# Patient Record
Sex: Female | Born: 1960 | Race: White | Hispanic: No | Smoking: Never smoker
Health system: Southern US, Community
[De-identification: ages and names within clinical notes are randomized; demographics above are authoritative.]

## PROBLEM LIST (undated history)

## (undated) DIAGNOSIS — T7840XA Allergy, unspecified, initial encounter: Secondary | ICD-10-CM

## (undated) DIAGNOSIS — G629 Polyneuropathy, unspecified: Secondary | ICD-10-CM

## (undated) DIAGNOSIS — E785 Hyperlipidemia, unspecified: Secondary | ICD-10-CM

## (undated) DIAGNOSIS — N189 Chronic kidney disease, unspecified: Secondary | ICD-10-CM

## (undated) DIAGNOSIS — J45909 Unspecified asthma, uncomplicated: Secondary | ICD-10-CM

## (undated) DIAGNOSIS — F419 Anxiety disorder, unspecified: Secondary | ICD-10-CM

## (undated) DIAGNOSIS — E079 Disorder of thyroid, unspecified: Secondary | ICD-10-CM

## (undated) DIAGNOSIS — K259 Gastric ulcer, unspecified as acute or chronic, without hemorrhage or perforation: Secondary | ICD-10-CM

## (undated) DIAGNOSIS — K219 Gastro-esophageal reflux disease without esophagitis: Secondary | ICD-10-CM

## (undated) DIAGNOSIS — K449 Diaphragmatic hernia without obstruction or gangrene: Secondary | ICD-10-CM

## (undated) DIAGNOSIS — C73 Malignant neoplasm of thyroid gland: Principal | ICD-10-CM

## (undated) DIAGNOSIS — C801 Malignant (primary) neoplasm, unspecified: Secondary | ICD-10-CM

## (undated) HISTORY — DX: Malignant neoplasm of thyroid gland: C73

## (undated) HISTORY — PX: KNEE ARTHROSCOPY: SUR90

## (undated) HISTORY — DX: Gastro-esophageal reflux disease without esophagitis: K21.9

## (undated) HISTORY — DX: Malignant (primary) neoplasm, unspecified: C80.1

## (undated) HISTORY — PX: BLADDER AUGMENTATION: SHX1233

## (undated) HISTORY — PX: ABDOMINAL HYSTERECTOMY: SHX81

## (undated) HISTORY — DX: Chronic kidney disease, unspecified: N18.9

## (undated) HISTORY — PX: SHOULDER ARTHROSCOPY: SHX128

## (undated) HISTORY — DX: Unspecified asthma, uncomplicated: J45.909

## (undated) HISTORY — DX: Diaphragmatic hernia without obstruction or gangrene: K44.9

## (undated) HISTORY — DX: Disorder of thyroid, unspecified: E07.9

## (undated) HISTORY — DX: Hyperlipidemia, unspecified: E78.5

## (undated) HISTORY — DX: Gastric ulcer, unspecified as acute or chronic, without hemorrhage or perforation: K25.9

## (undated) HISTORY — DX: Allergy, unspecified, initial encounter: T78.40XA

## (undated) HISTORY — DX: Anxiety disorder, unspecified: F41.9

## (undated) HISTORY — DX: Polyneuropathy, unspecified: G62.9

---

## 2001-07-31 DIAGNOSIS — K259 Gastric ulcer, unspecified as acute or chronic, without hemorrhage or perforation: Secondary | ICD-10-CM

## 2001-07-31 HISTORY — DX: Gastric ulcer, unspecified as acute or chronic, without hemorrhage or perforation: K25.9

## 2005-07-31 DIAGNOSIS — C801 Malignant (primary) neoplasm, unspecified: Secondary | ICD-10-CM

## 2005-07-31 HISTORY — DX: Malignant (primary) neoplasm, unspecified: C80.1

## 2014-02-13 LAB — TB SKIN TEST: TB Skin Test: NEGATIVE

## 2014-07-27 ENCOUNTER — Encounter: Payer: Self-pay | Admitting: Family Medicine

## 2014-08-07 DIAGNOSIS — M542 Cervicalgia: Secondary | ICD-10-CM | POA: Diagnosis not present

## 2014-08-07 DIAGNOSIS — M9901 Segmental and somatic dysfunction of cervical region: Secondary | ICD-10-CM | POA: Diagnosis not present

## 2014-08-18 DIAGNOSIS — M542 Cervicalgia: Secondary | ICD-10-CM | POA: Diagnosis not present

## 2014-08-18 DIAGNOSIS — M9901 Segmental and somatic dysfunction of cervical region: Secondary | ICD-10-CM | POA: Diagnosis not present

## 2014-08-28 DIAGNOSIS — E079 Disorder of thyroid, unspecified: Secondary | ICD-10-CM | POA: Diagnosis not present

## 2014-09-04 DIAGNOSIS — M542 Cervicalgia: Secondary | ICD-10-CM | POA: Diagnosis not present

## 2014-09-04 DIAGNOSIS — M9901 Segmental and somatic dysfunction of cervical region: Secondary | ICD-10-CM | POA: Diagnosis not present

## 2014-09-07 DIAGNOSIS — Z1231 Encounter for screening mammogram for malignant neoplasm of breast: Secondary | ICD-10-CM | POA: Diagnosis not present

## 2014-09-08 DIAGNOSIS — E079 Disorder of thyroid, unspecified: Secondary | ICD-10-CM | POA: Diagnosis not present

## 2014-09-08 DIAGNOSIS — E039 Hypothyroidism, unspecified: Secondary | ICD-10-CM | POA: Diagnosis not present

## 2014-09-08 DIAGNOSIS — Z0000 Encounter for general adult medical examination without abnormal findings: Secondary | ICD-10-CM | POA: Diagnosis not present

## 2014-09-08 DIAGNOSIS — R42 Dizziness and giddiness: Secondary | ICD-10-CM | POA: Diagnosis not present

## 2014-09-08 DIAGNOSIS — E785 Hyperlipidemia, unspecified: Secondary | ICD-10-CM | POA: Diagnosis not present

## 2014-09-08 DIAGNOSIS — R109 Unspecified abdominal pain: Secondary | ICD-10-CM | POA: Diagnosis not present

## 2014-09-15 DIAGNOSIS — R109 Unspecified abdominal pain: Secondary | ICD-10-CM | POA: Diagnosis not present

## 2014-09-18 DIAGNOSIS — E785 Hyperlipidemia, unspecified: Secondary | ICD-10-CM | POA: Diagnosis not present

## 2014-09-18 DIAGNOSIS — R42 Dizziness and giddiness: Secondary | ICD-10-CM | POA: Diagnosis not present

## 2014-09-21 DIAGNOSIS — M9900 Segmental and somatic dysfunction of head region: Secondary | ICD-10-CM | POA: Diagnosis not present

## 2014-09-21 DIAGNOSIS — M531 Cervicobrachial syndrome: Secondary | ICD-10-CM | POA: Diagnosis not present

## 2014-10-29 DIAGNOSIS — M25552 Pain in left hip: Secondary | ICD-10-CM | POA: Diagnosis not present

## 2014-10-29 DIAGNOSIS — M545 Low back pain: Secondary | ICD-10-CM | POA: Diagnosis not present

## 2014-10-29 DIAGNOSIS — M24852 Other specific joint derangements of left hip, not elsewhere classified: Secondary | ICD-10-CM | POA: Diagnosis not present

## 2014-11-18 DIAGNOSIS — J329 Chronic sinusitis, unspecified: Secondary | ICD-10-CM | POA: Diagnosis not present

## 2014-12-04 DIAGNOSIS — M545 Low back pain: Secondary | ICD-10-CM | POA: Diagnosis not present

## 2014-12-07 DIAGNOSIS — M545 Low back pain: Secondary | ICD-10-CM | POA: Diagnosis not present

## 2014-12-08 DIAGNOSIS — M9900 Segmental and somatic dysfunction of head region: Secondary | ICD-10-CM | POA: Diagnosis not present

## 2014-12-08 DIAGNOSIS — M531 Cervicobrachial syndrome: Secondary | ICD-10-CM | POA: Diagnosis not present

## 2014-12-11 DIAGNOSIS — M9900 Segmental and somatic dysfunction of head region: Secondary | ICD-10-CM | POA: Diagnosis not present

## 2014-12-11 DIAGNOSIS — M531 Cervicobrachial syndrome: Secondary | ICD-10-CM | POA: Diagnosis not present

## 2014-12-14 DIAGNOSIS — M531 Cervicobrachial syndrome: Secondary | ICD-10-CM | POA: Diagnosis not present

## 2014-12-14 DIAGNOSIS — M545 Low back pain: Secondary | ICD-10-CM | POA: Diagnosis not present

## 2014-12-14 DIAGNOSIS — M9900 Segmental and somatic dysfunction of head region: Secondary | ICD-10-CM | POA: Diagnosis not present

## 2014-12-14 DIAGNOSIS — F419 Anxiety disorder, unspecified: Secondary | ICD-10-CM | POA: Diagnosis not present

## 2014-12-16 DIAGNOSIS — M531 Cervicobrachial syndrome: Secondary | ICD-10-CM | POA: Diagnosis not present

## 2014-12-16 DIAGNOSIS — M9900 Segmental and somatic dysfunction of head region: Secondary | ICD-10-CM | POA: Diagnosis not present

## 2014-12-21 DIAGNOSIS — M531 Cervicobrachial syndrome: Secondary | ICD-10-CM | POA: Diagnosis not present

## 2014-12-21 DIAGNOSIS — M9900 Segmental and somatic dysfunction of head region: Secondary | ICD-10-CM | POA: Diagnosis not present

## 2014-12-22 DIAGNOSIS — M545 Low back pain: Secondary | ICD-10-CM | POA: Diagnosis not present

## 2015-01-05 DIAGNOSIS — M545 Low back pain: Secondary | ICD-10-CM | POA: Diagnosis not present

## 2015-01-12 DIAGNOSIS — M545 Low back pain: Secondary | ICD-10-CM | POA: Diagnosis not present

## 2015-01-18 DIAGNOSIS — C73 Malignant neoplasm of thyroid gland: Secondary | ICD-10-CM | POA: Diagnosis not present

## 2015-01-19 DIAGNOSIS — I498 Other specified cardiac arrhythmias: Secondary | ICD-10-CM | POA: Diagnosis not present

## 2015-01-19 DIAGNOSIS — C73 Malignant neoplasm of thyroid gland: Secondary | ICD-10-CM | POA: Diagnosis not present

## 2015-01-19 DIAGNOSIS — E780 Pure hypercholesterolemia: Secondary | ICD-10-CM | POA: Diagnosis not present

## 2015-01-19 DIAGNOSIS — R002 Palpitations: Secondary | ICD-10-CM | POA: Diagnosis not present

## 2015-01-20 DIAGNOSIS — M531 Cervicobrachial syndrome: Secondary | ICD-10-CM | POA: Diagnosis not present

## 2015-01-20 DIAGNOSIS — M9900 Segmental and somatic dysfunction of head region: Secondary | ICD-10-CM | POA: Diagnosis not present

## 2015-01-26 DIAGNOSIS — M545 Low back pain: Secondary | ICD-10-CM | POA: Diagnosis not present

## 2015-01-27 DIAGNOSIS — M531 Cervicobrachial syndrome: Secondary | ICD-10-CM | POA: Diagnosis not present

## 2015-01-27 DIAGNOSIS — M9900 Segmental and somatic dysfunction of head region: Secondary | ICD-10-CM | POA: Diagnosis not present

## 2015-02-02 DIAGNOSIS — M545 Low back pain: Secondary | ICD-10-CM | POA: Diagnosis not present

## 2015-02-04 DIAGNOSIS — R002 Palpitations: Secondary | ICD-10-CM | POA: Diagnosis not present

## 2015-03-22 DIAGNOSIS — C73 Malignant neoplasm of thyroid gland: Secondary | ICD-10-CM | POA: Diagnosis not present

## 2015-03-22 DIAGNOSIS — I471 Supraventricular tachycardia: Secondary | ICD-10-CM | POA: Diagnosis not present

## 2015-04-23 DIAGNOSIS — M5430 Sciatica, unspecified side: Secondary | ICD-10-CM | POA: Diagnosis not present

## 2015-04-23 DIAGNOSIS — R109 Unspecified abdominal pain: Secondary | ICD-10-CM | POA: Diagnosis not present

## 2015-04-23 DIAGNOSIS — I471 Supraventricular tachycardia: Secondary | ICD-10-CM | POA: Diagnosis not present

## 2015-05-04 DIAGNOSIS — Z8719 Personal history of other diseases of the digestive system: Secondary | ICD-10-CM | POA: Diagnosis not present

## 2015-05-04 DIAGNOSIS — I471 Supraventricular tachycardia: Secondary | ICD-10-CM | POA: Diagnosis not present

## 2015-05-04 DIAGNOSIS — Z87891 Personal history of nicotine dependence: Secondary | ICD-10-CM | POA: Diagnosis not present

## 2015-05-04 DIAGNOSIS — R002 Palpitations: Secondary | ICD-10-CM | POA: Diagnosis not present

## 2015-05-04 DIAGNOSIS — Z7982 Long term (current) use of aspirin: Secondary | ICD-10-CM | POA: Diagnosis not present

## 2015-05-04 DIAGNOSIS — Z85850 Personal history of malignant neoplasm of thyroid: Secondary | ICD-10-CM | POA: Diagnosis not present

## 2015-05-04 DIAGNOSIS — J9811 Atelectasis: Secondary | ICD-10-CM | POA: Diagnosis not present

## 2015-05-04 DIAGNOSIS — E785 Hyperlipidemia, unspecified: Secondary | ICD-10-CM | POA: Diagnosis not present

## 2015-05-04 DIAGNOSIS — K267 Chronic duodenal ulcer without hemorrhage or perforation: Secondary | ICD-10-CM | POA: Diagnosis not present

## 2015-05-04 DIAGNOSIS — R0600 Dyspnea, unspecified: Secondary | ICD-10-CM | POA: Diagnosis not present

## 2015-05-04 DIAGNOSIS — M5430 Sciatica, unspecified side: Secondary | ICD-10-CM | POA: Diagnosis not present

## 2015-05-04 DIAGNOSIS — R9431 Abnormal electrocardiogram [ECG] [EKG]: Secondary | ICD-10-CM | POA: Diagnosis not present

## 2015-05-04 DIAGNOSIS — Z01810 Encounter for preprocedural cardiovascular examination: Secondary | ICD-10-CM | POA: Diagnosis not present

## 2015-05-04 DIAGNOSIS — Z5189 Encounter for other specified aftercare: Secondary | ICD-10-CM | POA: Diagnosis not present

## 2015-05-04 DIAGNOSIS — K449 Diaphragmatic hernia without obstruction or gangrene: Secondary | ICD-10-CM | POA: Diagnosis not present

## 2015-05-04 DIAGNOSIS — Z79899 Other long term (current) drug therapy: Secondary | ICD-10-CM | POA: Diagnosis not present

## 2015-05-04 DIAGNOSIS — R5383 Other fatigue: Secondary | ICD-10-CM | POA: Diagnosis not present

## 2015-05-05 DIAGNOSIS — Z85850 Personal history of malignant neoplasm of thyroid: Secondary | ICD-10-CM | POA: Diagnosis not present

## 2015-05-05 DIAGNOSIS — Z8719 Personal history of other diseases of the digestive system: Secondary | ICD-10-CM | POA: Diagnosis not present

## 2015-05-05 DIAGNOSIS — R5383 Other fatigue: Secondary | ICD-10-CM | POA: Diagnosis not present

## 2015-05-05 DIAGNOSIS — R002 Palpitations: Secondary | ICD-10-CM | POA: Diagnosis not present

## 2015-05-05 DIAGNOSIS — I471 Supraventricular tachycardia: Secondary | ICD-10-CM | POA: Diagnosis not present

## 2015-05-05 DIAGNOSIS — Z79899 Other long term (current) drug therapy: Secondary | ICD-10-CM | POA: Diagnosis not present

## 2015-05-07 DIAGNOSIS — E860 Dehydration: Secondary | ICD-10-CM | POA: Diagnosis present

## 2015-05-07 DIAGNOSIS — Z9889 Other specified postprocedural states: Secondary | ICD-10-CM | POA: Diagnosis not present

## 2015-05-07 DIAGNOSIS — R778 Other specified abnormalities of plasma proteins: Secondary | ICD-10-CM | POA: Diagnosis not present

## 2015-05-07 DIAGNOSIS — K810 Acute cholecystitis: Secondary | ICD-10-CM | POA: Diagnosis not present

## 2015-05-07 DIAGNOSIS — C73 Malignant neoplasm of thyroid gland: Secondary | ICD-10-CM | POA: Diagnosis not present

## 2015-05-07 DIAGNOSIS — E890 Postprocedural hypothyroidism: Secondary | ICD-10-CM | POA: Diagnosis present

## 2015-05-07 DIAGNOSIS — R7989 Other specified abnormal findings of blood chemistry: Secondary | ICD-10-CM | POA: Diagnosis not present

## 2015-05-07 DIAGNOSIS — R112 Nausea with vomiting, unspecified: Secondary | ICD-10-CM | POA: Diagnosis present

## 2015-05-07 DIAGNOSIS — J90 Pleural effusion, not elsewhere classified: Secondary | ICD-10-CM | POA: Diagnosis not present

## 2015-05-07 DIAGNOSIS — R110 Nausea: Secondary | ICD-10-CM | POA: Diagnosis not present

## 2015-05-07 DIAGNOSIS — N179 Acute kidney failure, unspecified: Secondary | ICD-10-CM | POA: Diagnosis not present

## 2015-05-07 DIAGNOSIS — R160 Hepatomegaly, not elsewhere classified: Secondary | ICD-10-CM | POA: Diagnosis not present

## 2015-05-07 DIAGNOSIS — Z886 Allergy status to analgesic agent status: Secondary | ICD-10-CM | POA: Diagnosis not present

## 2015-05-07 DIAGNOSIS — K449 Diaphragmatic hernia without obstruction or gangrene: Secondary | ICD-10-CM | POA: Diagnosis present

## 2015-05-07 DIAGNOSIS — Z85850 Personal history of malignant neoplasm of thyroid: Secondary | ICD-10-CM | POA: Diagnosis not present

## 2015-05-07 DIAGNOSIS — I481 Persistent atrial fibrillation: Secondary | ICD-10-CM | POA: Diagnosis present

## 2015-05-07 DIAGNOSIS — R748 Abnormal levels of other serum enzymes: Secondary | ICD-10-CM | POA: Diagnosis not present

## 2015-05-07 DIAGNOSIS — R1011 Right upper quadrant pain: Secondary | ICD-10-CM | POA: Diagnosis not present

## 2015-05-07 DIAGNOSIS — J9811 Atelectasis: Secondary | ICD-10-CM | POA: Diagnosis not present

## 2015-05-07 DIAGNOSIS — I4891 Unspecified atrial fibrillation: Secondary | ICD-10-CM | POA: Diagnosis not present

## 2015-05-07 DIAGNOSIS — R109 Unspecified abdominal pain: Secondary | ICD-10-CM | POA: Diagnosis not present

## 2015-05-07 DIAGNOSIS — I480 Paroxysmal atrial fibrillation: Secondary | ICD-10-CM | POA: Diagnosis not present

## 2015-05-07 DIAGNOSIS — Z884 Allergy status to anesthetic agent status: Secondary | ICD-10-CM | POA: Diagnosis not present

## 2015-05-07 DIAGNOSIS — E785 Hyperlipidemia, unspecified: Secondary | ICD-10-CM | POA: Diagnosis not present

## 2015-05-07 DIAGNOSIS — R1084 Generalized abdominal pain: Secondary | ICD-10-CM | POA: Diagnosis not present

## 2015-05-07 DIAGNOSIS — Z87891 Personal history of nicotine dependence: Secondary | ICD-10-CM | POA: Diagnosis not present

## 2015-05-07 DIAGNOSIS — K819 Cholecystitis, unspecified: Secondary | ICD-10-CM | POA: Diagnosis not present

## 2015-05-07 DIAGNOSIS — I319 Disease of pericardium, unspecified: Secondary | ICD-10-CM | POA: Diagnosis not present

## 2015-05-07 DIAGNOSIS — R188 Other ascites: Secondary | ICD-10-CM | POA: Diagnosis not present

## 2015-05-07 DIAGNOSIS — I340 Nonrheumatic mitral (valve) insufficiency: Secondary | ICD-10-CM | POA: Diagnosis not present

## 2015-05-07 DIAGNOSIS — I313 Pericardial effusion (noninflammatory): Secondary | ICD-10-CM | POA: Diagnosis present

## 2015-05-07 DIAGNOSIS — E039 Hypothyroidism, unspecified: Secondary | ICD-10-CM | POA: Diagnosis not present

## 2015-05-07 DIAGNOSIS — I482 Chronic atrial fibrillation: Secondary | ICD-10-CM | POA: Diagnosis not present

## 2015-05-11 DIAGNOSIS — D649 Anemia, unspecified: Secondary | ICD-10-CM | POA: Diagnosis not present

## 2015-05-11 DIAGNOSIS — N390 Urinary tract infection, site not specified: Secondary | ICD-10-CM | POA: Diagnosis not present

## 2015-05-11 DIAGNOSIS — I313 Pericardial effusion (noninflammatory): Secondary | ICD-10-CM | POA: Diagnosis not present

## 2015-05-11 DIAGNOSIS — R938 Abnormal findings on diagnostic imaging of other specified body structures: Secondary | ICD-10-CM | POA: Diagnosis not present

## 2015-05-11 DIAGNOSIS — K824 Cholesterolosis of gallbladder: Secondary | ICD-10-CM | POA: Diagnosis not present

## 2015-05-11 DIAGNOSIS — R160 Hepatomegaly, not elsewhere classified: Secondary | ICD-10-CM | POA: Diagnosis not present

## 2015-05-11 DIAGNOSIS — Z9889 Other specified postprocedural states: Secondary | ICD-10-CM | POA: Diagnosis not present

## 2015-05-11 DIAGNOSIS — Z8679 Personal history of other diseases of the circulatory system: Secondary | ICD-10-CM | POA: Diagnosis not present

## 2015-05-11 DIAGNOSIS — E876 Hypokalemia: Secondary | ICD-10-CM | POA: Diagnosis not present

## 2015-05-17 DIAGNOSIS — R0602 Shortness of breath: Secondary | ICD-10-CM | POA: Diagnosis not present

## 2015-05-17 DIAGNOSIS — I340 Nonrheumatic mitral (valve) insufficiency: Secondary | ICD-10-CM | POA: Diagnosis not present

## 2015-05-17 DIAGNOSIS — E785 Hyperlipidemia, unspecified: Secondary | ICD-10-CM | POA: Diagnosis not present

## 2015-05-19 DIAGNOSIS — R140 Abdominal distension (gaseous): Secondary | ICD-10-CM | POA: Diagnosis not present

## 2015-05-19 DIAGNOSIS — R109 Unspecified abdominal pain: Secondary | ICD-10-CM | POA: Diagnosis not present

## 2015-05-19 DIAGNOSIS — K30 Functional dyspepsia: Secondary | ICD-10-CM | POA: Diagnosis not present

## 2015-05-19 DIAGNOSIS — K219 Gastro-esophageal reflux disease without esophagitis: Secondary | ICD-10-CM | POA: Diagnosis not present

## 2015-05-20 DIAGNOSIS — I4891 Unspecified atrial fibrillation: Secondary | ICD-10-CM | POA: Diagnosis not present

## 2015-05-21 DIAGNOSIS — Z85850 Personal history of malignant neoplasm of thyroid: Secondary | ICD-10-CM | POA: Diagnosis not present

## 2015-05-21 DIAGNOSIS — R0781 Pleurodynia: Secondary | ICD-10-CM | POA: Diagnosis not present

## 2015-05-21 DIAGNOSIS — Z90710 Acquired absence of both cervix and uterus: Secondary | ICD-10-CM | POA: Diagnosis not present

## 2015-05-21 DIAGNOSIS — R1013 Epigastric pain: Secondary | ICD-10-CM | POA: Diagnosis present

## 2015-05-21 DIAGNOSIS — E785 Hyperlipidemia, unspecified: Secondary | ICD-10-CM | POA: Diagnosis present

## 2015-05-21 DIAGNOSIS — K824 Cholesterolosis of gallbladder: Secondary | ICD-10-CM | POA: Diagnosis present

## 2015-05-21 DIAGNOSIS — I309 Acute pericarditis, unspecified: Secondary | ICD-10-CM | POA: Diagnosis not present

## 2015-05-21 DIAGNOSIS — R1110 Vomiting, unspecified: Secondary | ICD-10-CM | POA: Diagnosis not present

## 2015-05-21 DIAGNOSIS — I301 Infective pericarditis: Secondary | ICD-10-CM | POA: Diagnosis not present

## 2015-05-21 DIAGNOSIS — I319 Disease of pericardium, unspecified: Secondary | ICD-10-CM | POA: Diagnosis not present

## 2015-05-21 DIAGNOSIS — M79661 Pain in right lower leg: Secondary | ICD-10-CM | POA: Diagnosis not present

## 2015-05-21 DIAGNOSIS — I10 Essential (primary) hypertension: Secondary | ICD-10-CM | POA: Diagnosis present

## 2015-05-21 DIAGNOSIS — I4891 Unspecified atrial fibrillation: Secondary | ICD-10-CM | POA: Diagnosis not present

## 2015-05-21 DIAGNOSIS — I313 Pericardial effusion (noninflammatory): Secondary | ICD-10-CM | POA: Diagnosis not present

## 2015-05-21 DIAGNOSIS — R0602 Shortness of breath: Secondary | ICD-10-CM | POA: Diagnosis not present

## 2015-05-21 DIAGNOSIS — I312 Hemopericardium, not elsewhere classified: Secondary | ICD-10-CM | POA: Diagnosis not present

## 2015-05-21 DIAGNOSIS — F419 Anxiety disorder, unspecified: Secondary | ICD-10-CM | POA: Diagnosis present

## 2015-05-21 DIAGNOSIS — R079 Chest pain, unspecified: Secondary | ICD-10-CM | POA: Diagnosis not present

## 2015-05-21 DIAGNOSIS — K219 Gastro-esophageal reflux disease without esophagitis: Secondary | ICD-10-CM | POA: Diagnosis not present

## 2015-05-21 DIAGNOSIS — K449 Diaphragmatic hernia without obstruction or gangrene: Secondary | ICD-10-CM | POA: Diagnosis present

## 2015-05-21 DIAGNOSIS — Z01818 Encounter for other preprocedural examination: Secondary | ICD-10-CM | POA: Diagnosis not present

## 2015-05-21 DIAGNOSIS — M7989 Other specified soft tissue disorders: Secondary | ICD-10-CM | POA: Diagnosis not present

## 2015-05-21 DIAGNOSIS — J9811 Atelectasis: Secondary | ICD-10-CM | POA: Diagnosis not present

## 2015-05-21 DIAGNOSIS — M79662 Pain in left lower leg: Secondary | ICD-10-CM | POA: Diagnosis not present

## 2015-05-21 DIAGNOSIS — J90 Pleural effusion, not elsewhere classified: Secondary | ICD-10-CM | POA: Diagnosis not present

## 2015-05-21 DIAGNOSIS — Z87891 Personal history of nicotine dependence: Secondary | ICD-10-CM | POA: Diagnosis not present

## 2015-05-21 DIAGNOSIS — R112 Nausea with vomiting, unspecified: Secondary | ICD-10-CM | POA: Diagnosis not present

## 2015-05-25 DIAGNOSIS — I313 Pericardial effusion (noninflammatory): Secondary | ICD-10-CM | POA: Diagnosis not present

## 2015-05-31 DIAGNOSIS — R1110 Vomiting, unspecified: Secondary | ICD-10-CM | POA: Diagnosis not present

## 2015-06-03 DIAGNOSIS — I313 Pericardial effusion (noninflammatory): Secondary | ICD-10-CM | POA: Diagnosis not present

## 2015-06-04 DIAGNOSIS — D649 Anemia, unspecified: Secondary | ICD-10-CM | POA: Diagnosis not present

## 2015-06-05 DIAGNOSIS — K449 Diaphragmatic hernia without obstruction or gangrene: Secondary | ICD-10-CM | POA: Diagnosis not present

## 2015-06-05 DIAGNOSIS — R079 Chest pain, unspecified: Secondary | ICD-10-CM | POA: Diagnosis not present

## 2015-06-05 DIAGNOSIS — R000 Tachycardia, unspecified: Secondary | ICD-10-CM | POA: Diagnosis not present

## 2015-06-05 DIAGNOSIS — I32 Pericarditis in diseases classified elsewhere: Secondary | ICD-10-CM | POA: Diagnosis not present

## 2015-06-05 DIAGNOSIS — I319 Disease of pericardium, unspecified: Secondary | ICD-10-CM | POA: Diagnosis not present

## 2015-06-05 DIAGNOSIS — F329 Major depressive disorder, single episode, unspecified: Secondary | ICD-10-CM | POA: Diagnosis not present

## 2015-06-05 DIAGNOSIS — E785 Hyperlipidemia, unspecified: Secondary | ICD-10-CM | POA: Diagnosis not present

## 2015-06-05 DIAGNOSIS — I313 Pericardial effusion (noninflammatory): Secondary | ICD-10-CM | POA: Diagnosis not present

## 2015-06-05 DIAGNOSIS — I4891 Unspecified atrial fibrillation: Secondary | ICD-10-CM | POA: Diagnosis not present

## 2015-06-05 LAB — PROTIME-INR: INR: 1 (ref 0.9–1.1)

## 2015-06-06 DIAGNOSIS — Z9889 Other specified postprocedural states: Secondary | ICD-10-CM | POA: Diagnosis not present

## 2015-06-06 DIAGNOSIS — R509 Fever, unspecified: Secondary | ICD-10-CM | POA: Diagnosis not present

## 2015-06-06 DIAGNOSIS — K8080 Other cholelithiasis without obstruction: Secondary | ICD-10-CM | POA: Diagnosis present

## 2015-06-06 DIAGNOSIS — F419 Anxiety disorder, unspecified: Secondary | ICD-10-CM | POA: Diagnosis not present

## 2015-06-06 DIAGNOSIS — R110 Nausea: Secondary | ICD-10-CM | POA: Diagnosis not present

## 2015-06-06 DIAGNOSIS — Z8249 Family history of ischemic heart disease and other diseases of the circulatory system: Secondary | ICD-10-CM | POA: Diagnosis not present

## 2015-06-06 DIAGNOSIS — Z886 Allergy status to analgesic agent status: Secondary | ICD-10-CM | POA: Diagnosis not present

## 2015-06-06 DIAGNOSIS — I300 Acute nonspecific idiopathic pericarditis: Secondary | ICD-10-CM | POA: Diagnosis not present

## 2015-06-06 DIAGNOSIS — Z87891 Personal history of nicotine dependence: Secondary | ICD-10-CM | POA: Diagnosis not present

## 2015-06-06 DIAGNOSIS — Z85850 Personal history of malignant neoplasm of thyroid: Secondary | ICD-10-CM | POA: Diagnosis not present

## 2015-06-06 DIAGNOSIS — R109 Unspecified abdominal pain: Secondary | ICD-10-CM | POA: Diagnosis not present

## 2015-06-06 DIAGNOSIS — F329 Major depressive disorder, single episode, unspecified: Secondary | ICD-10-CM | POA: Diagnosis not present

## 2015-06-06 DIAGNOSIS — R0781 Pleurodynia: Secondary | ICD-10-CM | POA: Diagnosis present

## 2015-06-06 DIAGNOSIS — Z884 Allergy status to anesthetic agent status: Secondary | ICD-10-CM | POA: Diagnosis not present

## 2015-06-06 DIAGNOSIS — R0602 Shortness of breath: Secondary | ICD-10-CM | POA: Diagnosis present

## 2015-06-06 DIAGNOSIS — J90 Pleural effusion, not elsewhere classified: Secondary | ICD-10-CM | POA: Diagnosis not present

## 2015-06-06 DIAGNOSIS — I4891 Unspecified atrial fibrillation: Secondary | ICD-10-CM | POA: Diagnosis not present

## 2015-06-06 DIAGNOSIS — K449 Diaphragmatic hernia without obstruction or gangrene: Secondary | ICD-10-CM | POA: Diagnosis present

## 2015-06-06 DIAGNOSIS — I313 Pericardial effusion (noninflammatory): Secondary | ICD-10-CM | POA: Diagnosis not present

## 2015-06-06 DIAGNOSIS — I319 Disease of pericardium, unspecified: Secondary | ICD-10-CM | POA: Diagnosis present

## 2015-06-06 DIAGNOSIS — A419 Sepsis, unspecified organism: Secondary | ICD-10-CM | POA: Diagnosis not present

## 2015-06-06 DIAGNOSIS — J9811 Atelectasis: Secondary | ICD-10-CM | POA: Diagnosis not present

## 2015-06-06 DIAGNOSIS — Z833 Family history of diabetes mellitus: Secondary | ICD-10-CM | POA: Diagnosis not present

## 2015-06-06 DIAGNOSIS — E785 Hyperlipidemia, unspecified: Secondary | ICD-10-CM | POA: Diagnosis present

## 2015-06-06 DIAGNOSIS — K219 Gastro-esophageal reflux disease without esophagitis: Secondary | ICD-10-CM | POA: Diagnosis present

## 2015-06-06 DIAGNOSIS — C73 Malignant neoplasm of thyroid gland: Secondary | ICD-10-CM | POA: Diagnosis not present

## 2015-06-06 DIAGNOSIS — F332 Major depressive disorder, recurrent severe without psychotic features: Secondary | ICD-10-CM | POA: Diagnosis not present

## 2015-06-06 DIAGNOSIS — R079 Chest pain, unspecified: Secondary | ICD-10-CM | POA: Diagnosis not present

## 2015-06-06 DIAGNOSIS — I309 Acute pericarditis, unspecified: Secondary | ICD-10-CM | POA: Diagnosis not present

## 2015-06-08 DIAGNOSIS — I313 Pericardial effusion (noninflammatory): Secondary | ICD-10-CM | POA: Diagnosis not present

## 2015-06-08 DIAGNOSIS — J9811 Atelectasis: Secondary | ICD-10-CM | POA: Diagnosis not present

## 2015-06-08 DIAGNOSIS — A419 Sepsis, unspecified organism: Secondary | ICD-10-CM | POA: Diagnosis not present

## 2015-06-08 DIAGNOSIS — J90 Pleural effusion, not elsewhere classified: Secondary | ICD-10-CM | POA: Diagnosis not present

## 2015-06-16 DIAGNOSIS — I319 Disease of pericardium, unspecified: Secondary | ICD-10-CM | POA: Diagnosis not present

## 2015-06-16 DIAGNOSIS — M9900 Segmental and somatic dysfunction of head region: Secondary | ICD-10-CM | POA: Diagnosis not present

## 2015-06-16 DIAGNOSIS — M436 Torticollis: Secondary | ICD-10-CM | POA: Diagnosis not present

## 2015-06-17 DIAGNOSIS — I319 Disease of pericardium, unspecified: Secondary | ICD-10-CM | POA: Diagnosis not present

## 2015-06-18 DIAGNOSIS — M9900 Segmental and somatic dysfunction of head region: Secondary | ICD-10-CM | POA: Diagnosis not present

## 2015-06-18 DIAGNOSIS — M436 Torticollis: Secondary | ICD-10-CM | POA: Diagnosis not present

## 2015-07-12 DIAGNOSIS — F419 Anxiety disorder, unspecified: Secondary | ICD-10-CM | POA: Diagnosis not present

## 2015-07-12 DIAGNOSIS — I318 Other specified diseases of pericardium: Secondary | ICD-10-CM | POA: Diagnosis not present

## 2015-07-12 DIAGNOSIS — I319 Disease of pericardium, unspecified: Secondary | ICD-10-CM | POA: Diagnosis not present

## 2015-07-15 DIAGNOSIS — Z129 Encounter for screening for malignant neoplasm, site unspecified: Secondary | ICD-10-CM | POA: Diagnosis not present

## 2015-07-15 DIAGNOSIS — C73 Malignant neoplasm of thyroid gland: Secondary | ICD-10-CM | POA: Diagnosis not present

## 2015-07-21 DIAGNOSIS — C73 Malignant neoplasm of thyroid gland: Secondary | ICD-10-CM | POA: Diagnosis not present

## 2015-07-29 DIAGNOSIS — I319 Disease of pericardium, unspecified: Secondary | ICD-10-CM | POA: Diagnosis not present

## 2015-08-12 DIAGNOSIS — I319 Disease of pericardium, unspecified: Secondary | ICD-10-CM | POA: Diagnosis not present

## 2015-08-26 DIAGNOSIS — C73 Malignant neoplasm of thyroid gland: Secondary | ICD-10-CM | POA: Diagnosis not present

## 2015-08-26 DIAGNOSIS — Z111 Encounter for screening for respiratory tuberculosis: Secondary | ICD-10-CM | POA: Diagnosis not present

## 2015-10-02 DIAGNOSIS — M9901 Segmental and somatic dysfunction of cervical region: Secondary | ICD-10-CM | POA: Diagnosis not present

## 2015-10-02 DIAGNOSIS — M9903 Segmental and somatic dysfunction of lumbar region: Secondary | ICD-10-CM | POA: Diagnosis not present

## 2015-10-02 DIAGNOSIS — M542 Cervicalgia: Secondary | ICD-10-CM | POA: Diagnosis not present

## 2015-10-02 DIAGNOSIS — M545 Low back pain: Secondary | ICD-10-CM | POA: Diagnosis not present

## 2015-10-07 DIAGNOSIS — N390 Urinary tract infection, site not specified: Secondary | ICD-10-CM | POA: Diagnosis not present

## 2015-10-07 DIAGNOSIS — R399 Unspecified symptoms and signs involving the genitourinary system: Secondary | ICD-10-CM | POA: Diagnosis not present

## 2015-10-11 DIAGNOSIS — M9902 Segmental and somatic dysfunction of thoracic region: Secondary | ICD-10-CM | POA: Diagnosis not present

## 2015-10-11 DIAGNOSIS — M546 Pain in thoracic spine: Secondary | ICD-10-CM | POA: Diagnosis not present

## 2015-10-15 DIAGNOSIS — M546 Pain in thoracic spine: Secondary | ICD-10-CM | POA: Diagnosis not present

## 2015-10-15 DIAGNOSIS — M9902 Segmental and somatic dysfunction of thoracic region: Secondary | ICD-10-CM | POA: Diagnosis not present

## 2015-10-19 DIAGNOSIS — M9902 Segmental and somatic dysfunction of thoracic region: Secondary | ICD-10-CM | POA: Diagnosis not present

## 2015-10-19 DIAGNOSIS — M546 Pain in thoracic spine: Secondary | ICD-10-CM | POA: Diagnosis not present

## 2015-11-25 DIAGNOSIS — I313 Pericardial effusion (noninflammatory): Secondary | ICD-10-CM | POA: Diagnosis not present

## 2015-11-25 DIAGNOSIS — F419 Anxiety disorder, unspecified: Secondary | ICD-10-CM | POA: Diagnosis not present

## 2015-11-25 DIAGNOSIS — I471 Supraventricular tachycardia: Secondary | ICD-10-CM | POA: Diagnosis not present

## 2015-11-25 DIAGNOSIS — R42 Dizziness and giddiness: Secondary | ICD-10-CM | POA: Diagnosis not present

## 2015-11-25 DIAGNOSIS — I319 Disease of pericardium, unspecified: Secondary | ICD-10-CM | POA: Diagnosis not present

## 2015-12-09 DIAGNOSIS — C73 Malignant neoplasm of thyroid gland: Secondary | ICD-10-CM | POA: Diagnosis not present

## 2015-12-22 DIAGNOSIS — I313 Pericardial effusion (noninflammatory): Secondary | ICD-10-CM | POA: Diagnosis not present

## 2015-12-28 DIAGNOSIS — C73 Malignant neoplasm of thyroid gland: Secondary | ICD-10-CM | POA: Diagnosis not present

## 2015-12-31 DIAGNOSIS — I319 Disease of pericardium, unspecified: Secondary | ICD-10-CM | POA: Diagnosis not present

## 2015-12-31 DIAGNOSIS — I471 Supraventricular tachycardia: Secondary | ICD-10-CM | POA: Diagnosis not present

## 2015-12-31 DIAGNOSIS — F419 Anxiety disorder, unspecified: Secondary | ICD-10-CM | POA: Diagnosis not present

## 2016-01-05 DIAGNOSIS — R42 Dizziness and giddiness: Secondary | ICD-10-CM | POA: Diagnosis not present

## 2016-01-10 ENCOUNTER — Encounter: Payer: Self-pay | Admitting: Family Medicine

## 2016-01-10 DIAGNOSIS — C73 Malignant neoplasm of thyroid gland: Secondary | ICD-10-CM | POA: Diagnosis not present

## 2016-01-14 DIAGNOSIS — Z9889 Other specified postprocedural states: Secondary | ICD-10-CM | POA: Diagnosis not present

## 2016-01-17 DIAGNOSIS — R42 Dizziness and giddiness: Secondary | ICD-10-CM | POA: Diagnosis not present

## 2016-01-17 DIAGNOSIS — E079 Disorder of thyroid, unspecified: Secondary | ICD-10-CM | POA: Diagnosis not present

## 2016-01-20 DIAGNOSIS — D649 Anemia, unspecified: Secondary | ICD-10-CM | POA: Diagnosis not present

## 2016-01-20 DIAGNOSIS — E559 Vitamin D deficiency, unspecified: Secondary | ICD-10-CM | POA: Diagnosis not present

## 2016-01-20 DIAGNOSIS — C73 Malignant neoplasm of thyroid gland: Secondary | ICD-10-CM | POA: Diagnosis not present

## 2016-01-20 DIAGNOSIS — E079 Disorder of thyroid, unspecified: Secondary | ICD-10-CM | POA: Diagnosis not present

## 2016-01-20 DIAGNOSIS — R197 Diarrhea, unspecified: Secondary | ICD-10-CM | POA: Diagnosis not present

## 2016-01-24 DIAGNOSIS — K219 Gastro-esophageal reflux disease without esophagitis: Secondary | ICD-10-CM | POA: Diagnosis not present

## 2016-01-24 DIAGNOSIS — E890 Postprocedural hypothyroidism: Secondary | ICD-10-CM | POA: Diagnosis not present

## 2016-01-24 DIAGNOSIS — E785 Hyperlipidemia, unspecified: Secondary | ICD-10-CM | POA: Diagnosis not present

## 2016-01-24 DIAGNOSIS — C73 Malignant neoplasm of thyroid gland: Secondary | ICD-10-CM | POA: Diagnosis not present

## 2016-01-25 DIAGNOSIS — N390 Urinary tract infection, site not specified: Secondary | ICD-10-CM | POA: Diagnosis not present

## 2016-01-26 DIAGNOSIS — Z85850 Personal history of malignant neoplasm of thyroid: Secondary | ICD-10-CM | POA: Diagnosis not present

## 2016-01-28 DIAGNOSIS — M9903 Segmental and somatic dysfunction of lumbar region: Secondary | ICD-10-CM | POA: Diagnosis not present

## 2016-01-28 DIAGNOSIS — M545 Low back pain: Secondary | ICD-10-CM | POA: Diagnosis not present

## 2016-02-02 DIAGNOSIS — E039 Hypothyroidism, unspecified: Secondary | ICD-10-CM | POA: Diagnosis not present

## 2016-04-27 ENCOUNTER — Ambulatory Visit (INDEPENDENT_AMBULATORY_CARE_PROVIDER_SITE_OTHER): Payer: Medicare Other | Admitting: Family Medicine

## 2016-04-27 ENCOUNTER — Ambulatory Visit (INDEPENDENT_AMBULATORY_CARE_PROVIDER_SITE_OTHER): Payer: Medicare Other

## 2016-04-27 ENCOUNTER — Encounter: Payer: Self-pay | Admitting: Family Medicine

## 2016-04-27 ENCOUNTER — Encounter (INDEPENDENT_AMBULATORY_CARE_PROVIDER_SITE_OTHER): Payer: Self-pay

## 2016-04-27 VITALS — BP 134/81 | HR 67 | Temp 98.6°F | Ht 70.0 in | Wt 179.5 lb

## 2016-04-27 DIAGNOSIS — E039 Hypothyroidism, unspecified: Secondary | ICD-10-CM | POA: Diagnosis not present

## 2016-04-27 DIAGNOSIS — E785 Hyperlipidemia, unspecified: Secondary | ICD-10-CM | POA: Insufficient documentation

## 2016-04-27 DIAGNOSIS — C73 Malignant neoplasm of thyroid gland: Secondary | ICD-10-CM

## 2016-04-27 DIAGNOSIS — Z1159 Encounter for screening for other viral diseases: Secondary | ICD-10-CM | POA: Diagnosis not present

## 2016-04-27 DIAGNOSIS — Z114 Encounter for screening for human immunodeficiency virus [HIV]: Secondary | ICD-10-CM

## 2016-04-27 DIAGNOSIS — Z780 Asymptomatic menopausal state: Secondary | ICD-10-CM

## 2016-04-27 DIAGNOSIS — Z85850 Personal history of malignant neoplasm of thyroid: Secondary | ICD-10-CM | POA: Diagnosis not present

## 2016-04-27 DIAGNOSIS — K219 Gastro-esophageal reflux disease without esophagitis: Secondary | ICD-10-CM | POA: Insufficient documentation

## 2016-04-27 HISTORY — DX: Malignant neoplasm of thyroid gland: C73

## 2016-04-27 NOTE — Progress Notes (Signed)
BP 134/81   Pulse 67   Temp 98.6 F (37 C) (Oral)   Ht 5' 10"  (1.778 m)   Wt 179 lb 8 oz (81.4 kg)   BMI 25.76 kg/m    Subjective:    Patient ID: Tina Gardner, female    DOB: 08/11/1960, 55 y.o.   MRN: [id number]  HPI: Tina Gardner is a 55 y.o. female presenting on 04/27/2016 for Establish Care   HPI Hypothyroidism Patient is coming in today to establish care with our office. She is coming in for thyroid check. She is currently on levothyroxine 125 g daily. She denies any issues with hair changes or nail changes or palpitations constipation or diarrhea or chest pain. Patient has a history of thyroid cancer and she is needing a new hematologist/oncologist here because she moved here to our area and her previous was too far away that she cannot continue to follow up with them. She said that most recently she had had some controversial scans and it said that there may be a nodule left lower may not be a nodule left and she had 2 different physicians that were telling her different things. She wants to go here to get established with somebody to try and get to the bottom of that. We will request the records so that we can get them to the hematology/oncology here. She just moved here from [place].  Hyperlipidemia Patient admits that she had been diagnosed previously with high cholesterol but is not currently on any medications but wants to go ahead and get this checked. Patient denies headaches, blurred vision, chest pains, shortness of breath, or weakness. Denies any side effects from medication and is content with current medication.   Patient is postmenopausal and is due for a DEXA scan and wants to go ahead and do that today.  GERD Patient has been having issues with belching and burping and some epigastric abdominal discomfort and pressure. This is been increasing over the past few months. She normally takes Zantac 75 milligrams once a day but feels like it is not  working.  Relevant past medical, surgical, family and social history reviewed and updated as indicated. Interim medical history since our last visit reviewed. Allergies and medications reviewed and updated.  Review of Systems  Constitutional: Negative for chills and fever.  HENT: Negative for congestion, ear discharge and ear pain.   Eyes: Negative for redness and visual disturbance.  Respiratory: Negative for chest tightness and shortness of breath.   Cardiovascular: Negative for chest pain, palpitations and leg swelling.  Gastrointestinal: Positive for abdominal pain. Negative for blood in stool, diarrhea, nausea and vomiting.  Endocrine: Negative for cold intolerance and heat intolerance.  Genitourinary: Negative for difficulty urinating and dysuria.  Musculoskeletal: Negative for back pain and gait problem.  Skin: Negative for rash.  Neurological: Negative for dizziness, light-headedness and headaches.  Psychiatric/Behavioral: Negative for agitation and behavioral problems.  All other systems reviewed and are negative.   Per HPI unless specifically indicated above  Social History   Social History  . Marital status: Widowed    Spouse name: N/A  . Number of children: N/A  . Years of education: N/A   Occupational History  . Not on file.   Social History Main Topics  . Smoking status: Never Smoker  . Smokeless tobacco: Never Used  . Alcohol use No  . Drug use: No  . Sexual activity: Not Currently   Other Topics Concern  . Not on file  Social History Narrative  . No narrative on file    Past Surgical History:  Procedure Laterality Date  . ABDOMINAL HYSTERECTOMY    . BLADDER AUGMENTATION    . KNEE ARTHROSCOPY    . SHOULDER ARTHROSCOPY      Family History  Problem Relation Age of Onset  . Arthritis Mother   . Hypertension Mother   . Hyperlipidemia Mother   . Depression Father       Medication List       Accurate as of 04/27/16 10:30 AM. Always use your  most recent med list.          levothyroxine 125 MCG tablet Commonly known as:  SYNTHROID, LEVOTHROID Take 125 mcg by mouth daily before breakfast.          Objective:    BP 134/81   Pulse 67   Temp 98.6 F (37 C) (Oral)   Ht 5' 10"  (1.778 m)   Wt 179 lb 8 oz (81.4 kg)   BMI 25.76 kg/m   Wt Readings from Last 3 Encounters:  04/27/16 179 lb 8 oz (81.4 kg)    Physical Exam  Constitutional: She is oriented to person, place, and time. She appears well-developed and well-nourished. No distress.  Eyes: Conjunctivae are normal.  Neck: Neck supple. No thyromegaly present.  Cardiovascular: Normal rate, regular rhythm, normal heart sounds and intact distal pulses.   No murmur heard. Pulmonary/Chest: Effort normal and breath sounds normal. No respiratory distress. She has no wheezes.  Abdominal: Soft. Bowel sounds are normal. She exhibits no distension. There is tenderness (Mild epigastric tenderness). There is no rebound and no guarding.  Musculoskeletal: Normal range of motion. She exhibits no edema or tenderness.  Lymphadenopathy:    She has no cervical adenopathy.  Neurological: She is alert and oriented to person, place, and time. Coordination normal.  Skin: Skin is warm and dry. No rash noted. She is not diaphoretic.  Psychiatric: She has a normal mood and affect. Her behavior is normal.  Nursing note and vitals reviewed.   No results found for this or any previous visit.    Assessment & Plan:   Problem List Items Addressed This Visit      Digestive   GERD (gastroesophageal reflux disease)   Relevant Orders   CMP14+EGFR (Completed)   CBC with Differential/Platelet (Completed)     Endocrine   Hypothyroidism   Relevant Medications   levothyroxine (SYNTHROID, LEVOTHROID) 112 MCG tablet   Other Relevant Orders   TSH (Completed)     Other   Hyperlipidemia LDL goal <130   Relevant Orders   Lipid panel (Completed)   History of thyroid cancer   Relevant Orders    Ambulatory referral to Hematology    Other Visit Diagnoses    Postmenopausal    -  Primary   Relevant Orders   DG Bone Density   Need for hepatitis C screening test       Relevant Orders   Hepatitis C antibody (Completed)   Screening for HIV without presence of risk factors       Relevant Orders   HIV antibody (Completed)       Follow up plan: Return in about 4 weeks (around 05/25/2016), or if symptoms worsen or fail to improve, for Well woman exam and Pap.  Caryl Pina, MD [facility] 04/27/2016, 10:30 AM

## 2016-04-28 LAB — LIPID PANEL
CHOL/HDL RATIO: 3.8 ratio (ref 0.0–4.4)
Cholesterol, Total: 227 mg/dL — ABNORMAL HIGH (ref 100–199)
HDL: 59 mg/dL (ref >39)
LDL CALC: 135 mg/dL — AB (ref 0–99)
TRIGLYCERIDES: 163 mg/dL — AB (ref 0–149)
VLDL CHOLESTEROL CAL: 33 mg/dL (ref 5–40)

## 2016-04-28 LAB — CBC WITH DIFFERENTIAL/PLATELET
BASOS: 1 %
Basophils Absolute: 0 10*3/uL (ref 0.0–0.2)
EOS (ABSOLUTE): 0.1 10*3/uL (ref 0.0–0.4)
Eos: 1 %
Hematocrit: 35.7 % (ref 34.0–46.6)
Hemoglobin: 12.1 g/dL (ref 11.1–15.9)
IMMATURE GRANULOCYTES: 0 %
Immature Grans (Abs): 0 10*3/uL (ref 0.0–0.1)
LYMPHS ABS: 1.5 10*3/uL (ref 0.7–3.1)
Lymphs: 36 %
MCH: 29.7 pg (ref 26.6–33.0)
MCHC: 33.9 g/dL (ref 31.5–35.7)
MCV: 88 fL (ref 79–97)
MONOS ABS: 0.2 10*3/uL (ref 0.1–0.9)
Monocytes: 5 %
NEUTROS PCT: 57 %
Neutrophils Absolute: 2.3 10*3/uL (ref 1.4–7.0)
PLATELETS: 195 10*3/uL (ref 150–379)
RBC: 4.08 x10E6/uL (ref 3.77–5.28)
RDW: 13.3 % (ref 12.3–15.4)
WBC: 4.1 10*3/uL (ref 3.4–10.8)

## 2016-04-28 LAB — CMP14+EGFR
ALBUMIN: 4.9 g/dL (ref 3.5–5.5)
ALT: 15 IU/L (ref 0–32)
AST: 14 IU/L (ref 0–40)
Albumin/Globulin Ratio: 2.1 (ref 1.2–2.2)
Alkaline Phosphatase: 26 IU/L — ABNORMAL LOW (ref 39–117)
BUN / CREAT RATIO: 25 — AB (ref 9–23)
BUN: 22 mg/dL (ref 6–24)
Bilirubin Total: 0.8 mg/dL (ref 0.0–1.2)
CALCIUM: 9.3 mg/dL (ref 8.7–10.2)
CO2: 23 mmol/L (ref 18–29)
CREATININE: 0.87 mg/dL (ref 0.57–1.00)
Chloride: 100 mmol/L (ref 96–106)
GFR calc Af Amer: 88 mL/min/{1.73_m2} (ref >59)
GFR, EST NON AFRICAN AMERICAN: 76 mL/min/{1.73_m2} (ref >59)
GLOBULIN, TOTAL: 2.3 g/dL (ref 1.5–4.5)
Glucose: 93 mg/dL (ref 65–99)
Potassium: 4.3 mmol/L (ref 3.5–5.2)
SODIUM: 139 mmol/L (ref 134–144)
Total Protein: 7.2 g/dL (ref 6.0–8.5)

## 2016-04-28 LAB — HIV ANTIBODY (ROUTINE TESTING W REFLEX): HIV Screen 4th Generation wRfx: NONREACTIVE

## 2016-04-28 LAB — HEPATITIS C ANTIBODY: Hep C Virus Ab: <0.1 s/co ratio (ref 0.0–0.9)

## 2016-04-28 LAB — TSH: TSH: 0.053 u[IU]/mL — ABNORMAL LOW (ref 0.450–4.500)

## 2016-04-28 MED ORDER — LEVOTHYROXINE SODIUM 112 MCG PO TABS: 112.0000 ug | ORAL_TABLET | Freq: Every day | ORAL | 3 refills | Status: DC

## 2016-05-01 ENCOUNTER — Telehealth: Payer: Self-pay | Admitting: Family Medicine

## 2016-05-01 NOTE — Telephone Encounter (Signed)
Patient aware of results and message sent back to provider in lab results.

## 2016-05-02 ENCOUNTER — Telehealth: Payer: Self-pay | Admitting: Family Medicine

## 2016-05-03 NOTE — Telephone Encounter (Signed)
refaxed request as directed

## 2016-05-10 ENCOUNTER — Encounter: Payer: Self-pay | Admitting: Family Medicine

## 2016-05-10 ENCOUNTER — Ambulatory Visit (INDEPENDENT_AMBULATORY_CARE_PROVIDER_SITE_OTHER): Payer: Medicare Other | Admitting: Family Medicine

## 2016-05-10 VITALS — BP 120/78 | HR 70 | Temp 99.2°F | Ht 70.0 in | Wt 174.5 lb

## 2016-05-10 DIAGNOSIS — H60543 Acute eczematoid otitis externa, bilateral: Secondary | ICD-10-CM

## 2016-05-10 NOTE — Progress Notes (Signed)
BP 120/78   Pulse 70   Temp 99.2 F (37.3 C) (Oral)   Ht 5\' 10"  (1.778 m)   Wt 174 lb 8 oz (79.2 kg)   BMI 25.04 kg/m    Subjective:    Patient ID: Tina Gardner, female    DOB: 11-20-1960, 55 y.o.   MRN: [id number]  HPI: Tina Gardner is a 55 y.o. female presenting on 05/10/2016 for Left ear pain (has had drainage from left ear, roaring noise in ear)   HPI Left ear discomfort and drainage and ringing Patient has been having left ear discomfort and some waxy drainage and ringing in left ear but also has some of each in right ear was not nearly as bad. This is been going on for at least a week or maybe more. Doesn't have to some hearing loss she started to notice but just with hypertension. Patient denies any fevers or chills or nasal symptoms or upper respiratory symptoms. Is mostly in the ears. She does admit that she gets very itchy in her ear canals and feels like she has to scratch and they're all time.  Relevant past medical, surgical, family and social history reviewed and updated as indicated. Interim medical history since our last visit reviewed. Allergies and medications reviewed and updated.  Review of Systems  Constitutional: Negative for chills and fever.  HENT: Positive for ear discharge and tinnitus. Negative for congestion and ear pain.   Eyes: Negative for redness and visual disturbance.  Respiratory: Negative for cough, chest tightness, shortness of breath and wheezing.   Cardiovascular: Negative for chest pain and leg swelling.  Genitourinary: Negative for difficulty urinating and dysuria.  Musculoskeletal: Negative for back pain and gait problem.  Skin: Negative for rash.  Neurological: Negative for light-headedness and headaches.  Psychiatric/Behavioral: Negative for agitation and behavioral problems.  All other systems reviewed and are negative.   Per HPI unless specifically indicated above     Medication List       Accurate as of 05/10/16  4:19 PM.  Always use your most recent med list.          levothyroxine 125 MCG tablet Commonly known as:  SYNTHROID, LEVOTHROID Take 125 mcg by mouth daily before breakfast.   levothyroxine 112 MCG tablet Commonly known as:  SYNTHROID, LEVOTHROID Take 1 tablet (112 mcg total) by mouth daily.          Objective:    BP 120/78   Pulse 70   Temp 99.2 F (37.3 C) (Oral)   Ht 5\' 10"  (1.778 m)   Wt 174 lb 8 oz (79.2 kg)   BMI 25.04 kg/m   Wt Readings from Last 3 Encounters:  05/10/16 174 lb 8 oz (79.2 kg)  04/27/16 179 lb 8 oz (81.4 kg)    Physical Exam  Constitutional: She is oriented to person, place, and time. She appears well-developed and well-nourished. No distress.  HENT:  Right Ear: There is drainage (Wax buildup but not impaction). No swelling or tenderness. Tympanic membrane is not injected, not scarred, not perforated, not erythematous and not retracted. No middle ear effusion.  Left Ear: There is drainage (Wax buildup but not impaction). No swelling or tenderness. Tympanic membrane is not injected, not scarred, not perforated, not erythematous and not retracted.  No middle ear effusion.  Eyes: Conjunctivae are normal.  Neck: Neck supple. No thyromegaly present.  Cardiovascular: Normal rate, regular rhythm, normal heart sounds and intact distal pulses.   No murmur heard. Pulmonary/Chest: Effort  normal and breath sounds normal. No respiratory distress. She has no wheezes.  Musculoskeletal: Normal range of motion. She exhibits no edema or tenderness.  Lymphadenopathy:    She has no cervical adenopathy.  Neurological: She is alert and oriented to person, place, and time. Coordination normal.  Skin: Skin is warm and dry. No rash noted. She is not diaphoretic.  Psychiatric: She has a normal mood and affect. Her behavior is normal.  Nursing note and vitals reviewed.     Assessment & Plan:   Problem List Items Addressed This Visit    None    Visit Diagnoses    Eczema of  both external ears    -  Primary   Recommended mineral oil drops bilaterally 2-3 times daily.       Follow up plan: Return if symptoms worsen or fail to improve.  Counseling provided for all of the vaccine components No orders of the defined types were placed in this encounter.   Caryl Pina, MD [facility] 05/10/2016, 4:19 PM

## 2016-05-15 ENCOUNTER — Other Ambulatory Visit: Payer: Self-pay | Admitting: Family Medicine

## 2016-05-15 DIAGNOSIS — Z780 Asymptomatic menopausal state: Secondary | ICD-10-CM

## 2016-05-31 ENCOUNTER — Encounter (HOSPITAL_COMMUNITY): Payer: Medicare Other

## 2016-05-31 ENCOUNTER — Encounter (HOSPITAL_COMMUNITY): Payer: Medicare Other | Attending: Hematology | Admitting: Hematology

## 2016-05-31 ENCOUNTER — Encounter (HOSPITAL_COMMUNITY): Payer: Self-pay | Admitting: Hematology

## 2016-05-31 ENCOUNTER — Other Ambulatory Visit (HOSPITAL_COMMUNITY): Payer: Self-pay | Admitting: *Deleted

## 2016-05-31 VITALS — BP 131/80 | HR 64 | Temp 97.8°F | Resp 18 | Ht 70.0 in | Wt 174.5 lb

## 2016-05-31 DIAGNOSIS — C73 Malignant neoplasm of thyroid gland: Secondary | ICD-10-CM

## 2016-05-31 LAB — T4, FREE: Free T4: 1.4 ng/dL — ABNORMAL HIGH (ref 0.61–1.12)

## 2016-05-31 LAB — COMPREHENSIVE METABOLIC PANEL
ALK PHOS: 27 U/L — AB (ref 38–126)
ALT: 17 U/L (ref 14–54)
ANION GAP: 7 (ref 5–15)
AST: 16 U/L (ref 15–41)
Albumin: 4.7 g/dL (ref 3.5–5.0)
BUN: 22 mg/dL — ABNORMAL HIGH (ref 6–20)
CALCIUM: 9.3 mg/dL (ref 8.9–10.3)
CHLORIDE: 105 mmol/L (ref 101–111)
CO2: 24 mmol/L (ref 22–32)
Creatinine, Ser: 0.81 mg/dL (ref 0.44–1.00)
GFR calc non Af Amer: >60 mL/min (ref >60)
Glucose, Bld: 109 mg/dL — ABNORMAL HIGH (ref 65–99)
POTASSIUM: 4 mmol/L (ref 3.5–5.1)
SODIUM: 136 mmol/L (ref 135–145)
Total Bilirubin: 0.7 mg/dL (ref 0.3–1.2)
Total Protein: 7.4 g/dL (ref 6.5–8.1)

## 2016-05-31 LAB — CBC WITH DIFFERENTIAL/PLATELET
Basophils Absolute: 0 10*3/uL (ref 0.0–0.1)
Basophils Relative: 0 %
EOS ABS: 0.1 10*3/uL (ref 0.0–0.7)
EOS PCT: 2 %
HCT: 35.5 % — ABNORMAL LOW (ref 36.0–46.0)
Hemoglobin: 11.9 g/dL — ABNORMAL LOW (ref 12.0–15.0)
LYMPHS ABS: 1.6 10*3/uL (ref 0.7–4.0)
Lymphocytes Relative: 33 %
MCH: 29.6 pg (ref 26.0–34.0)
MCHC: 33.5 g/dL (ref 30.0–36.0)
MCV: 88.3 fL (ref 78.0–100.0)
MONO ABS: 0.2 10*3/uL (ref 0.1–1.0)
MONOS PCT: 4 %
Neutro Abs: 2.9 10*3/uL (ref 1.7–7.7)
Neutrophils Relative %: 61 %
PLATELETS: 178 10*3/uL (ref 150–400)
RBC: 4.02 MIL/uL (ref 3.87–5.11)
RDW: 12.5 % (ref 11.5–15.5)
WBC: 4.8 10*3/uL (ref 4.0–10.5)

## 2016-05-31 LAB — TSH: TSH: 0.048 u[IU]/mL — AB (ref 0.350–4.500)

## 2016-05-31 NOTE — Progress Notes (Signed)
[name]    HEMATOLOGY/ONCOLOGY CONSULTATION NOTE  Date of Service: 05/31/2016  Patient Care Team: Tina Rancher, MD as PCP - General (Family Medicine)  [facility] Endocrinology -Dr Tina Gardner            Cardiologist- Dr Tina Gardner                        CHIEF COMPLAINTS/PURPOSE OF CONSULTATION:  Thyroid cancer  HISTORY OF PRESENTING ILLNESS:   Tina Gardner is a wonderful 55 y.o. female who has been referred to [place] by Dr .Tina Rancher, MD  for evaluation and continued management of thyroid cancer.  Patient has multiple medical comorbidities as noted below including history of severe anxiety. She reports that she moved to [place] recently from [facility] and wanted to be set up for continued management of her previous history of papillary thyroid cancer which was predominantly being managed by her endocrinologist Dr. Juleen Gardner.  Her history appropriate thyroid cancer and subsequent treatments and follow-up have been beast together with her brought input and limited outside records.  Patient reports that she presented with neck nodule for couple of years and had an FNA that showed thyroid cancer. She subsequently underwent a total thyroidectomy on 07/11/2006 at [facility].  Surgical pathology showed a 2 cm x 1.8 cm x 1.5 cm papillary thyroid cancer. She underwent radioactive iodine ablation in favor 2007. She apparently underwent repeat reactive iodine ablation in 2008 due to positive uptake in the liver. PET/CT scan from 2008 through 2011 apparently showed activity in the liver and the tonsillar area and neck. She underwent bilateral tonsillectomy in 2010 due to positive activities in the tonsils which apparently did not show any cancer which showed bad infection as per patient's record. After 2011 imaging studies apparently showed no liver pathology. Iodine-131 whole-body scan on 11/22/2011 at [facility] apparently showed no evidence  of metastatic disease. Patient has been on a set suppression with levothyroxine 125 g per several years.   Her last available imaging studies with CT chest abdomen pelvis on 06/08/2015 at [facility] showed no overt evidence of metastatic disease. PET/CT scan at [facility] on 07/21/2015 showed status post total thyroidectomy status, no evidence of local recurrence or metastatic disease. Small pericardial effusion.  Last available labs showed negative antithyroglobulin antibody and undetectable thyroglobulin level on 12/28/2015. TSH was 0.2 with a free T4 of 1.87 at the time.  Patient continues to be on levothyroxine 125 g. She does not have a local endocrinologist at this time and was referred to [place] first. There is no evidence that the patient has radioactive iodine ablation refractory or active well differentiated thyroid cancer at this time.   MEDICAL HISTORY:  Past Medical History:  Diagnosis Date  . Allergy   . Anxiety   . Asthma   . Cancer [facility]) 2007   thyroid  . Chronic kidney disease   . GERD (gastroesophageal reflux disease)   . Hiatal hernia   . Hyperlipidemia   . Neuropathy ([place])   . Stomach ulcer 2003  . Thyroid disease   Kidney stones Paroxysmal atrial tachycardia status post ablation. He developed pericardial effusion thought to be from microperforation treated with colchicine and follow-up. Hepatitis A as a child Severe anxiety issues since age 67yrs -was previously on Lexapro Hiatal hernia with acid reflux and esophagitis Gastric ulcer  Hypothyroidism due to total thyroidectomy for thyroid cancer and chronic levothyroxine for TSH  suppression.  SURGICAL HISTORY: Past Surgical History:  Procedure Laterality Date  . ABDOMINAL HYSTERECTOMY    . BLADDER AUGMENTATION    . KNEE ARTHROSCOPY    . SHOULDER ARTHROSCOPY    Tonsillectomy Bladder lift Thyroidectomy -total  SOCIAL HISTORY: Social History   Social History  . Marital  status: Widowed    Spouse name: N/A  . Number of children: N/A  . Years of education: N/A   Occupational History  . Not on file.   Social History Main Topics  . Smoking status: Never Smoker  . Smokeless tobacco: Never Used  . Alcohol use No  . Drug use: No  . Sexual activity: Not Currently   Other Topics Concern  . Not on file   Social History Narrative  . No narrative on file  Previously worked in Programmer, applications with healthcare companies . She is a single parent with a special needs child with an autistic spectrum disorder .   FAMILY HISTORY: Family History  Problem Relation Age of Onset  . Arthritis Mother   . Hypertension Mother   . Hyperlipidemia Mother   . Depression Father   No family history of blood disorders or cancer . Patient reports that she does not know her father's side of the family much .   ALLERGIES:  is allergic to anesthesia [id number] [propofol].  MEDICATIONS:  Current Outpatient Prescriptions  Medication Sig Dispense Refill  . levothyroxine (SYNTHROID, LEVOTHROID) 112 MCG tablet Take 1 tablet (112 mcg total) by mouth daily. 90 tablet 3  . levothyroxine (SYNTHROID, LEVOTHROID) 125 MCG tablet Take 125 mcg by mouth daily before breakfast.     No current facility-administered medications for this visit.     REVIEW OF SYSTEMS:    10 Point review of Systems was done is negative except as noted above.  PHYSICAL EXAMINATION: ECOG PERFORMANCE STATUS: 1 - Symptomatic but completely ambulatory  . Vitals:   05/31/16 1200  BP: 131/80  Pulse: 64  Resp: 18  Temp: 97.8 F (36.6 C)   Filed Weights   05/31/16 1200  Weight: 174 lb 8 oz (79.2 kg)   .Body mass index is 25.04 kg/m.  GENERAL:alert, in no acute distress and comfortable SKIN: skin color, texture, turgor are normal, no rashes or significant lesions EYES: normal, conjunctiva are pink and non-injected, sclera clear OROPHARYNX:no exudate, no erythema and lips, buccal mucosa, and tongue  normal  NECK: supple, no JVD, No palpable neck nodules , non-tender, without nodularity LYMPH:  no palpable lymphadenopathy in the cervical, axillary or inguinal LUNGS: clear to auscultation with normal respiratory effort HEART: regular rate & rhythm,  no murmurs and no lower extremity edema ABDOMEN: abdomen soft, non-tender, normoactive bowel sounds  Musculoskeletal: no cyanosis of digits and no clubbing  PSYCH: alert & oriented x 3 with fluent speech NEURO: no focal motor/sensory deficits  LABORATORY DATA:  I have reviewed the data as listed  . CBC Latest Ref Rng & Units 05/31/2016 04/27/2016  WBC 4.0 - 10.5 K/uL 4.8 4.1  Hemoglobin 12.0 - 15.0 g/dL 11.9(L) -  Hematocrit 36.0 - 46.0 % 35.5(L) 35.7  Platelets 150 - 400 K/uL 178 195    . CMP Latest Ref Rng & Units 05/31/2016 04/27/2016  Glucose 65 - 99 mg/dL 109(H) 93  BUN 6 - 20 mg/dL 22(H) 22  Creatinine 0.44 - 1.00 mg/dL 0.81 0.87  Sodium 135 - 145 mmol/L 136 139  Potassium 3.5 - 5.1 mmol/L 4.0 4.3  Chloride 101 - 111 mmol/L 105 100  CO2 22 - 32 mmol/L 24 23  Calcium 8.9 - 10.3 mg/dL 9.3 9.3  Total Protein 6.5 - 8.1 g/dL 7.4 7.2  Total Bilirubin 0.3 - 1.2 mg/dL 0.7 0.8  Alkaline Phos 38 - 126 U/L 27(L) 26(L)  AST 15 - 41 U/L 16 14  ALT 14 - 54 U/L 17 15   Component     Latest Ref Rng & Units 05/31/2016  T4,Free(Direct)     0.61 - 1.12 ng/dL 1.40 (H)  TSH     0.350 - 4.500 uIU/mL 0.048 (L)     RADIOGRAPHIC STUDIES: I have personally reviewed the radiological images as listed and agreed with the findings in the report. Dg Wrfm Dexa  Result Date: 05/15/2016 EXAM: DG DXA BONE DENSITY STUDY The Bone Mineral Densitometry hard-copy report (which includes all data, graphical display, and FRAX results when applicable) has been sent directly to the ordering physician. This report can also be obtained electronically by viewing images for this exam through the performing facility's EMR, or by logging directly into [employer].  Electronically Signed   By: Lavonia Dana M.D.   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 Patient status post total thyroidectomy. She has had radioactive iodine ablation 2 in feb 2007 and then in 2008. There was some imaging concern for abnormal uptake in her tonsils and liver. She had bilateral tonsillectomy- pathology results aren't available but patient reports only infection noted. Her liver lesion was not biopsied to prove that it was metastatic papillary thyroid cancer. Liver lesion has not shown up on imaging since 2011.  Her last thyroglobulin level was undetectable with negative therapy latter bodies in May 2017. Last outside available PET/CT report from 07/21/2015 showed no evidence of local recurrence or metastatic disease. Plan -Patient is on levothyroxine 125 g by mouth daily for TSH suppression -She has been referred to endocrinology for management of her well-differentiated thyroid cancer for continued follow-up and for appropriate levothyroxine dosing for continued TSH suppression and to determine TSH goals for suppression. -She has had a history of paroxysmal atrial tachycardia and significant anxiety which might play into her plan for TSH suppression as well this has been previously ablated and we are uncertain that this is still an issue. -We shall get a thyroglobulin level and thyroglobulin antibody level today. -We will set her for a PET/CT scan -Return with Dr. Whitney Muse in 2 weeks to review her lab results and PET/CT findings. -If it is no evidence of active papillary thyroid cancer at this time she would be best served by continued follow-up with endocrinology and then consulting [place] in the setting of RAI refractory metastatic WDTC if the need arises. No role for TKI therapy at this time. -Will need close follow-up for evaluation and management of osteopenia/osteoporosis  with her primary care physician given thyroid over replacement and previous history of total hysterectomy with bilateral salpingo-oophorectomy.  #2 multiple medical comorbidities as noted above -Continue follow-up with primary care physician   All of the patients questions were answered with apparent satisfaction. The patient knows to call the clinic with any problems, questions or concerns.  I spent 60 minutes counseling the patient face to face. The total time spent in the appointment was 70 minutes and more than 50% was on counseling and direct patient cares.    Sullivan Lone MD [place] AAHIVMS [facility] Hematology/Oncology Physician [place]  [place]  (Office):       [phone] (Work cell):  [phone] (Fax):           [phone]

## 2016-05-31 NOTE — Patient Instructions (Addendum)
[place] at [facility]  Discharge Instructions:  Seen by MD [facility] today.  Pet scan within 1 week.  Follow up with MD [place] in 2 weeks. _______________________________________________________________  Thank you for choosing [place] at [facility] to provide your oncology and hematology care.  To afford each patient quality time with our providers, please arrive at least 15 minutes before your scheduled appointment.  You need to re-schedule your appointment if you arrive 10 or more minutes late.  We strive to give you quality time with our providers, and arriving late affects you and other patients whose appointments are after yours.  Also, if you no show three or more times for appointments you may be dismissed from the clinic.  Again, thank you for choosing [place] [facility] [place] hope is that these requests will allow you access to exceptional care and in a timely manner. _______________________________________________________________  If you have questions after your visit, please contact our office at [phone] between the hours of 8:30 a.m. and 5:00 p.m. Voicemails left after 4:30 p.m. will not be returned until the following business day. _______________________________________________________________  For prescription refill requests, have your pharmacy contact our office. _______________________________________________________________  Recommendations made by the consultant and any test results will be sent to your referring physician. _______________________________________________________________

## 2016-06-01 LAB — THYROGLOBULIN ANTIBODY: Thyroglobulin Antibody: <1 IU/mL (ref 0.0–0.9)

## 2016-06-08 ENCOUNTER — Encounter (HOSPITAL_COMMUNITY): Payer: Self-pay | Admitting: Radiology

## 2016-06-08 ENCOUNTER — Ambulatory Visit (HOSPITAL_COMMUNITY)
Admission: RE | Admit: 2016-06-08 | Discharge: 2016-06-08 | Disposition: A | Discharge status: Home / Self Care | Payer: Medicare Other | Source: Ambulatory Visit | Attending: Hematology | Admitting: Hematology

## 2016-06-08 DIAGNOSIS — C73 Malignant neoplasm of thyroid gland: Secondary | ICD-10-CM | POA: Diagnosis not present

## 2016-06-08 LAB — GLUCOSE, CAPILLARY: GLUCOSE-CAPILLARY: 94 mg/dL (ref 65–99)

## 2016-06-08 MED ORDER — FLUDEOXYGLUCOSE F - 18 (FDG) INJECTION
8.6000 | Freq: Once | INTRAVENOUS | Status: AC | PRN
  Administered 2016-06-08: 8.6 via INTRAVENOUS

## 2016-06-12 ENCOUNTER — Other Ambulatory Visit: Payer: Medicare Other | Admitting: Family Medicine

## 2016-06-13 ENCOUNTER — Telehealth: Payer: Self-pay | Admitting: Family Medicine

## 2016-06-13 ENCOUNTER — Encounter: Payer: Self-pay | Admitting: Family Medicine

## 2016-06-16 ENCOUNTER — Encounter (HOSPITAL_COMMUNITY): Payer: Medicare Other | Attending: Oncology | Admitting: Oncology

## 2016-06-16 ENCOUNTER — Encounter (HOSPITAL_COMMUNITY): Payer: Self-pay | Admitting: Oncology

## 2016-06-16 DIAGNOSIS — Z85850 Personal history of malignant neoplasm of thyroid: Secondary | ICD-10-CM | POA: Diagnosis present

## 2016-06-16 DIAGNOSIS — C73 Malignant neoplasm of thyroid gland: Secondary | ICD-10-CM

## 2016-06-16 NOTE — Assessment & Plan Note (Addendum)
Papillary thyroid cancer, predominantly managed by her endocrinologist, Dr. Juleen Starr, in [place], [place], initially diagnosed in 2007.  Labs performed on 05/31/2016: CBC diff, CMET, TSH, free T4, thyroglobulin antibody.  I personally reviewed and went over laboratory results with the patient.  The results are noted within this dictation.  I personally reviewed and went over radiographic studies with the patient.  The results are noted within this dictation.  PET scan on 06/08/2016 is reviewed and is negative for recurrent disease.  Referral to endocrinology has been made.  Referral notes are reviewed and it is noted that the endocrinology office has been trying to contact the patient but no working #.  The patient reports that her cell phone is broken and provides another phone number: [phone].  I will ask our scheduler to call endocrinology for an appointment that we can provide her today.  We dis receive an appointment date and time with Dr. Dwyane Dee: 07/03/2016 at 1245 PM.  Patient is advised.  No further follow-up with medical oncology is recommended.
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
## 2016-06-16 NOTE — Patient Instructions (Addendum)
[place] at [facility] Discharge Instructions  RECOMMENDATIONS MADE BY THE CONSULTANT AND ANY TEST RESULTS WILL BE SENT TO YOUR REFERRING PHYSICIAN.  You were seen today by Kirby Crigler PA-C. You have an appointment with the endocrinologist on 07/03/16 at 12:45, Dr. Dwyane Dee. You will not need anymore follow up here.    Thank you for choosing [place] at [facility] to provide your oncology and hematology care.  To afford each patient quality time with our provider, please arrive at least 15 minutes before your scheduled appointment time.   Beginning January 23rd 2017 lab work for the [facility] will be done in the  Main lab at [facility] on 1st floor. If you have a lab appointment with the [facility] please come in thru the  Main Entrance and check in at the main information desk  You need to re-schedule your appointment should you arrive 10 or more minutes late.  We strive to give you quality time with our providers, and arriving late affects you and other patients whose appointments are after yours.  Also, if you no show three or more times for appointments you may be dismissed from the clinic at the providers discretion.     Again, thank you for choosing [facility].  Our hope is that these requests will decrease the amount of time that you wait before being seen by our physicians.       _____________________________________________________________  Should you have questions after your visit to [facility], please contact our office at [phone] between the hours of 8:30 a.m. and 4:30 p.m.  Voicemails left after 4:30 p.m. will not be returned until the following business day.  For prescription refill requests, have your pharmacy contact our office.         Resources For Cancer Patients and their Caregivers ? American Cancer Society: Can assist with transportation, wigs, general needs, runs Look Good  Feel Better.        (450) 329-7210 ? Cancer Care: Provides financial assistance, online support groups, medication/co-pay assistance.  1-800-813-HOPE 8476459664) ? [facility] Assists [place] cancer patients and their families through emotional , educational and financial support.  3091406107 ? [place] DSS Where to apply for food stamps, Medicaid and utility assistance. 984-880-6343 ? [employer]: Transportation to medical appointments. 754 746 2788 ? Social Security Administration: May apply for disability if have a Stage IV cancer. (252)295-4748 (234)851-8769 ? [employer], Disability and Transit Services: Assists with nutrition, care and transit needs. [facility] Support Programs: @10RELATIVEDAYS @ > Cancer Support Group  2nd Tuesday of the month 1pm-2pm, Journey Room  > Creative Journey  3rd Tuesday of the month 1130am-1pm, Journey Room  > Look Good Feel Better  1st Wednesday of the month 10am-12 noon, Journey Room (Call [facility] to register [phone])

## 2016-06-21 ENCOUNTER — Telehealth: Payer: Self-pay | Admitting: Family Medicine

## 2016-06-21 MED ORDER — LEVOTHYROXINE SODIUM 125 MCG PO TABS: 125.0000 ug | ORAL_TABLET | Freq: Every day | ORAL | 3 refills | Status: DC

## 2016-06-21 NOTE — Telephone Encounter (Signed)
Pt aware of no show policy  Pt states she should be on 14mcg of levothyroxine - instead of 112. She had labs with oncology - in epic = can we change and refill the correct dose with her being seen right now

## 2016-06-21 NOTE — Telephone Encounter (Signed)
Yes go ahead and change the dose

## 2016-06-21 NOTE — Telephone Encounter (Signed)
Pt aware.

## 2016-07-03 ENCOUNTER — Ambulatory Visit: Payer: Medicare Other | Admitting: Endocrinology

## 2016-07-19 ENCOUNTER — Ambulatory Visit: Payer: Medicare Other | Admitting: Endocrinology

## 2016-08-30 ENCOUNTER — Encounter: Payer: Self-pay | Admitting: Family Medicine

## 2016-08-30 ENCOUNTER — Ambulatory Visit (INDEPENDENT_AMBULATORY_CARE_PROVIDER_SITE_OTHER): Payer: Medicare Other | Admitting: Family Medicine

## 2016-08-30 DIAGNOSIS — M51369 Other intervertebral disc degeneration, lumbar region without mention of lumbar back pain or lower extremity pain: Secondary | ICD-10-CM | POA: Insufficient documentation

## 2016-08-30 DIAGNOSIS — M5136 Other intervertebral disc degeneration, lumbar region: Secondary | ICD-10-CM | POA: Insufficient documentation

## 2016-08-30 MED ORDER — DICLOFENAC SODIUM 1 % TD GEL: 2.0000 g | Freq: Four times a day (QID) | TRANSDERMAL | 5 refills | Status: DC

## 2016-08-30 MED ORDER — METHYLPREDNISOLONE ACETATE 80 MG/ML IJ SUSP
80.0000 mg | Freq: Once | INTRAMUSCULAR | Status: AC
  Administered 2016-08-30: 80 mg via INTRAMUSCULAR
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
## 2016-09-06 ENCOUNTER — Other Ambulatory Visit: Payer: Self-pay | Admitting: Family Medicine

## 2016-09-06 DIAGNOSIS — M5136 Other intervertebral disc degeneration, lumbar region: Secondary | ICD-10-CM

## 2016-09-15 ENCOUNTER — Ambulatory Visit (HOSPITAL_COMMUNITY): Payer: Medicaid Other

## 2016-09-19 ENCOUNTER — Ambulatory Visit (HOSPITAL_COMMUNITY): Payer: Medicare Other

## 2016-09-27 ENCOUNTER — Ambulatory Visit (HOSPITAL_COMMUNITY): Admission: RE | Admit: 2016-09-27 | Payer: Medicaid Other | Source: Ambulatory Visit

## 2016-09-30 ENCOUNTER — Ambulatory Visit (HOSPITAL_COMMUNITY): Admission: RE | Admit: 2016-09-30 | Payer: Medicare Other | Source: Ambulatory Visit

## 2016-10-07 ENCOUNTER — Ambulatory Visit (HOSPITAL_COMMUNITY)
Admission: RE | Admit: 2016-10-07 | Discharge: 2016-10-07 | Disposition: A | Discharge status: Home / Self Care | Payer: Medicare Other | Source: Ambulatory Visit | Attending: Family Medicine | Admitting: Family Medicine

## 2016-10-07 DIAGNOSIS — M545 Low back pain: Secondary | ICD-10-CM | POA: Insufficient documentation

## 2016-10-07 DIAGNOSIS — G8929 Other chronic pain: Secondary | ICD-10-CM | POA: Insufficient documentation

## 2016-10-07 DIAGNOSIS — M5136 Other intervertebral disc degeneration, lumbar region: Secondary | ICD-10-CM | POA: Diagnosis not present

## 2016-10-09 ENCOUNTER — Other Ambulatory Visit: Payer: Self-pay

## 2016-10-09 ENCOUNTER — Telehealth: Payer: Self-pay

## 2016-10-09 DIAGNOSIS — M5136 Other intervertebral disc degeneration, lumbar region: Secondary | ICD-10-CM

## 2016-10-31 DIAGNOSIS — M25552 Pain in left hip: Secondary | ICD-10-CM | POA: Diagnosis not present

## 2016-10-31 DIAGNOSIS — R030 Elevated blood-pressure reading, without diagnosis of hypertension: Secondary | ICD-10-CM | POA: Diagnosis not present

## 2016-11-01 ENCOUNTER — Telehealth: Payer: Self-pay | Admitting: Family Medicine

## 2016-11-01 DIAGNOSIS — M5136 Other intervertebral disc degeneration, lumbar region: Secondary | ICD-10-CM

## 2016-11-01 NOTE — Telephone Encounter (Signed)
FYI

## 2016-11-17 NOTE — Telephone Encounter (Signed)
Disregard this encounter.

## 2016-11-23 ENCOUNTER — Ambulatory Visit: Payer: Medicare Other | Admitting: Family Medicine

## 2016-11-23 ENCOUNTER — Ambulatory Visit: Payer: Medicare Other | Admitting: Pediatrics

## 2016-11-23 NOTE — Telephone Encounter (Signed)
What symptoms do you have? Back pain  How long have you been sick? Pt had an appt with Det[facility]er about this problem.  Have you been seen for this problem? Yes, pt wants to talk to Det[facility]er. She was referred to a neurosurgeon and was not pleased. She is upset and wants another referral to see another doctor with her pain.   If your provider decides to give you a prescription, which pharmacy would you like for it to be sent to? No rx needed. Pt needs to talk to nurse or Dettinger   Patient informed that this information will be sent to the clinical staff for review and that they should receive a follow up call.

## 2016-11-23 NOTE — Telephone Encounter (Signed)
Please review and advise.

## 2016-11-24 NOTE — Telephone Encounter (Signed)
We can go ahead and refer her to a different neurosurgeon for a second opinion. If she wants to talk to me when we call her then come and get me I guess.

## 2016-11-24 NOTE — Telephone Encounter (Signed)
Spoke with patient and she would like to have a referral to another nerosurgeon for a second opinion

## 2016-11-24 NOTE — Telephone Encounter (Signed)
Pt aware.

## 2016-11-24 NOTE — Telephone Encounter (Signed)
Let's go ahead and do that referral then thank you

## 2016-11-27 ENCOUNTER — Encounter: Payer: Self-pay | Admitting: Family Medicine

## 2016-11-27 ENCOUNTER — Ambulatory Visit (INDEPENDENT_AMBULATORY_CARE_PROVIDER_SITE_OTHER): Payer: Medicare Other | Admitting: Family Medicine

## 2016-11-27 VITALS — BP 122/72 | HR 70 | Temp 98.6°F | Ht 70.0 in | Wt 169.6 lb

## 2016-11-27 DIAGNOSIS — M799 Soft tissue disorder, unspecified: Secondary | ICD-10-CM | POA: Diagnosis not present

## 2016-11-27 DIAGNOSIS — M791 Myalgia: Secondary | ICD-10-CM

## 2016-11-27 DIAGNOSIS — M7918 Myalgia, other site: Secondary | ICD-10-CM

## 2016-11-27 DIAGNOSIS — L209 Atopic dermatitis, unspecified: Secondary | ICD-10-CM

## 2016-11-27 DIAGNOSIS — M7989 Other specified soft tissue disorders: Secondary | ICD-10-CM

## 2016-11-27 MED ORDER — TRIAMCINOLONE ACETONIDE 0.1 % EX CREA: 1.0000 "application " | TOPICAL_CREAM | Freq: Two times a day (BID) | CUTANEOUS | 0 refills | Status: DC

## 2016-11-27 NOTE — Progress Notes (Signed)
BP 122/72   Pulse 70   Temp 98.6 F (37 C) (Oral)   Ht 5\' 10"  (1.778 m)   Wt 169 lb 9.6 oz (76.9 kg)   BMI 24.34 kg/m    Subjective:    Patient ID: Tina Gardner, female    DOB: Jun 01, 1961, 56 y.o.   MRN: [id number]  HPI: Tina Gardner is a 56 y.o. female presenting on 11/27/2016 for Urticaria (one spot on back, itches bad for last 3-4 wks she wants to dig it out, lump, no blisters, darker skin color to patient)   HPI KNot with overlying irritation and pruritus Patient has a knot in her thoracic back just below the base of the scapula near the rhomboid muscles or in them. She has some overlying skin irritation and excoriation where it appears she has been scratching at it. She denies any fevers or chills or redness or warmth. Could not has been there for a few weeks. She thinks her skin has been darker overlying it.  Relevant past medical, surgical, family and social history reviewed and updated as indicated. Interim medical history since our last visit reviewed. Allergies and medications reviewed and updated.  Review of Systems  Constitutional: Negative for chills and fever.  Eyes: Negative for redness and visual disturbance.  Respiratory: Negative for chest tightness and shortness of breath.   Cardiovascular: Negative for chest pain and leg swelling.  Musculoskeletal: Positive for myalgias. Negative for back pain and gait problem.  Skin: Positive for rash.  Neurological: Negative for dizziness, light-headedness and headaches.  Psychiatric/Behavioral: Negative for agitation and behavioral problems.  All other systems reviewed and are negative.  Per HPI unless specifically indicated above      Objective:    BP 122/72   Pulse 70   Temp 98.6 F (37 C) (Oral)   Ht 5\' 10"  (1.778 m)   Wt 169 lb 9.6 oz (76.9 kg)   BMI 24.34 kg/m   Wt Readings from Last 3 Encounters:  11/27/16 169 lb 9.6 oz (76.9 kg)  10/07/16 172 lb (78 kg)  08/30/16 172 lb (78 kg)    Physical Exam    Constitutional: She is oriented to person, place, and time. She appears well-developed and well-nourished. No distress.  Eyes: Conjunctivae are normal.  Cardiovascular: Normal rate, regular rhythm, normal heart sounds and intact distal pulses.   No murmur heard. Pulmonary/Chest: Effort normal and breath sounds normal. No respiratory distress. She has no wheezes. She has no rales.  Musculoskeletal: Normal range of motion. She exhibits tenderness. She exhibits no edema.       Thoracic back: She exhibits tenderness.       Back:  Neurological: She is alert and oriented to person, place, and time. Coordination normal.  Skin: Skin is warm and dry. No rash noted. She is not diaphoretic.  Psychiatric: She has a normal mood and affect. Her behavior is normal.  Nursing note and vitals reviewed.     Assessment & Plan:   Problem List Items Addressed This Visit    None    Visit Diagnoses    Rhomboid muscle pain    -  Primary   If ultrasound is negative, recommend massage therapy and stretching   Relevant Orders   [place] Misc Soft Tissue   Soft tissue mass       Likely due to muscular tightness but will do ultrasound to be sure.   Relevant Orders   [place] Misc Soft Tissue   Atopic dermatitis, unspecified type  Relevant Medications   triamcinolone cream (KENALOG) 0.1 %       Follow up plan: Return if symptoms worsen or fail to improve.  Counseling provided for all of the vaccine components Orders Placed This Encounter  Procedures  . [place] Misc Soft Tissue    Joshua Dettinger, MD [facility] 11/27/2016, 3:07 PM

## 2016-12-04 ENCOUNTER — Ambulatory Visit (HOSPITAL_COMMUNITY): Admission: RE | Admit: 2016-12-04 | Payer: Medicare Other | Source: Ambulatory Visit

## 2016-12-13 ENCOUNTER — Ambulatory Visit (HOSPITAL_COMMUNITY)
Admission: RE | Admit: 2016-12-13 | Discharge: 2016-12-13 | Disposition: A | Discharge status: Home / Self Care | Payer: Medicare Other | Source: Ambulatory Visit | Attending: Family Medicine | Admitting: Family Medicine

## 2016-12-13 ENCOUNTER — Other Ambulatory Visit: Payer: Self-pay | Admitting: Family Medicine

## 2016-12-13 DIAGNOSIS — M799 Soft tissue disorder, unspecified: Secondary | ICD-10-CM | POA: Insufficient documentation

## 2016-12-13 DIAGNOSIS — M791 Myalgia: Secondary | ICD-10-CM | POA: Insufficient documentation

## 2016-12-13 DIAGNOSIS — M7989 Other specified soft tissue disorders: Secondary | ICD-10-CM

## 2016-12-13 DIAGNOSIS — S46812A Strain of other muscles, fascia and tendons at shoulder and upper arm level, left arm, initial encounter: Secondary | ICD-10-CM | POA: Diagnosis not present

## 2016-12-13 DIAGNOSIS — M7918 Myalgia, other site: Secondary | ICD-10-CM

## 2017-01-22 ENCOUNTER — Encounter: Payer: Self-pay | Admitting: Family Medicine

## 2017-01-22 ENCOUNTER — Ambulatory Visit (INDEPENDENT_AMBULATORY_CARE_PROVIDER_SITE_OTHER): Payer: Medicare Other | Admitting: Family Medicine

## 2017-01-22 VITALS — BP 103/64 | HR 70 | Temp 98.1°F | Ht 70.0 in | Wt 172.0 lb

## 2017-01-22 DIAGNOSIS — H66001 Acute suppurative otitis media without spontaneous rupture of ear drum, right ear: Secondary | ICD-10-CM | POA: Diagnosis not present

## 2017-01-22 DIAGNOSIS — J0101 Acute recurrent maxillary sinusitis: Secondary | ICD-10-CM | POA: Diagnosis not present

## 2017-01-22 MED ORDER — BETAMETHASONE SOD PHOS & ACET 6 (3-3) MG/ML IJ SUSP
6.0000 mg | Freq: Once | INTRAMUSCULAR | Status: AC
  Administered 2017-01-22: 6 mg via INTRAMUSCULAR

## 2017-01-22 MED ORDER — PSEUDOEPHEDRINE-GUAIFENESIN ER 120-1200 MG PO TB12: 1.0000 | ORAL_TABLET | Freq: Two times a day (BID) | ORAL | 0 refills | Status: DC

## 2017-01-22 MED ORDER — AMOXICILLIN-POT CLAVULANATE 875-125 MG PO TABS: 1.0000 | ORAL_TABLET | Freq: Two times a day (BID) | ORAL | 0 refills | Status: DC

## 2017-01-22 NOTE — Progress Notes (Signed)
Chief Complaint  Patient presents with  . Sinus Problem    pt here today c/o sinus pressure and ear pressure, congested. She also is having dizzy spells.    HPI  Patient presents today for Symptoms include congestion,Bilateral ear pain, facial pain, nasal congestion, non productive cough, post nasal drip and sinus pressure. There is no fever, chills, or sweats. Onset of symptoms was a few weeks ago, gradually worsening since that time. She's had tinnitus for about 3 weeks as well.   PMH: Smoking status noted ROS: Per HPI  Objective: BP 103/64   Pulse 70   Temp 98.1 F (36.7 C) (Oral)   Ht 5\' 10"  (1.778 m)   Wt 172 lb (78 kg)   BMI 24.68 kg/m  Gen: NAD, alert, cooperative with exam HEENT: NCAT, EOMI, PERRL. TMs show fluid. The right side with moderate erythema. CV: RRR, good S1/S2, no murmur Resp: CTABL, no wheezes, non-labored Abd: SNTND, BS present, no guarding or organomegaly Ext: No edema, warm Neuro: Alert and oriented, No gross deficits  Assessment and plan:  1. Acute recurrent maxillary sinusitis   2. Acute suppurative otitis media of right ear without spontaneous rupture of tympanic membrane, recurrence not specified     Meds ordered this encounter  Medications  . ranitidine (ZANTAC) 75 MG tablet    Sig: Take 75 mg by mouth 2 (two) times daily.  [name] Kitchen amoxicillin-clavulanate (AUGMENTIN) 875-125 MG tablet    Sig: Take 1 tablet by mouth 2 (two) times daily. Take all of this medication    Dispense:  20 tablet    Refill:  0  . Pseudoephedrine-Guaifenesin [phone] MG TB12    Sig: Take 1 tablet by mouth 2 (two) times daily. For congestion    Dispense:  20 each    Refill:  0  . betamethasone acetate-betamethasone sodium phosphate (CELESTONE) injection 6 mg      Follow up as needed.  Claretta Fraise, MD

## 2017-01-23 ENCOUNTER — Telehealth: Payer: Self-pay | Admitting: *Deleted

## 2017-01-23 NOTE — Telephone Encounter (Signed)
Please contact the patient that is most likely a reaction to the decongestant, Mucinex D or generic equivalent. Try cutting back on that and see if it doesn't work out better.  Thanks, WS.

## 2017-01-24 ENCOUNTER — Ambulatory Visit (INDEPENDENT_AMBULATORY_CARE_PROVIDER_SITE_OTHER): Payer: Medicare Other | Admitting: Family Medicine

## 2017-01-24 ENCOUNTER — Encounter: Payer: Self-pay | Admitting: Family Medicine

## 2017-01-24 VITALS — BP 127/80 | HR 56 | Temp 98.0°F | Ht 70.0 in | Wt 170.0 lb

## 2017-01-24 DIAGNOSIS — Z131 Encounter for screening for diabetes mellitus: Secondary | ICD-10-CM | POA: Diagnosis not present

## 2017-01-24 DIAGNOSIS — J329 Chronic sinusitis, unspecified: Secondary | ICD-10-CM | POA: Diagnosis not present

## 2017-01-24 DIAGNOSIS — K219 Gastro-esophageal reflux disease without esophagitis: Secondary | ICD-10-CM

## 2017-01-24 DIAGNOSIS — E785 Hyperlipidemia, unspecified: Secondary | ICD-10-CM | POA: Diagnosis not present

## 2017-01-24 DIAGNOSIS — E039 Hypothyroidism, unspecified: Secondary | ICD-10-CM | POA: Diagnosis not present

## 2017-01-24 NOTE — Progress Notes (Signed)
BP 127/80   Pulse (!) 56   Temp 98 F (36.7 C) (Oral)   Ht _0  (1.778 m)   Wt 170 lb (77.1 kg)   BMI 24.39 kg/m    Subjective:    Patient ID: Tina Gardner, female    DOB: October 08, 1960, 56 y.o.   MRN: [id number]  HPI: Tina Gardner is a 56 y.o. female presenting on 01/24/2017 for Labwork (patient is requesting full lab panel, is fasting)   HPI Hypothyroidism recheck Patient is coming in for thyroid recheck today as well. They deny any issues with hair changes or heat or cold problems or diarrhea or constipation. They deny any chest pain or palpitations. They are currently on levothyroxine 125 micrograms   Hyperlipidemia Patient is coming in for recheck of his hyperlipidemia. The patient is currently taking dietary control. They deny any issues with myalgias or history of liver damage from it. They deny any focal numbness or weakness or chest pain.   GERD recheck Patient is coming in for a GERD recheck today. She is currently on Zantac 75 twice a day and says it is doing well for her. She denies any major significant issues with this. She has a blood in her stool or nausea or vomiting. She denies any abdominal pain.  Sinus congestion and pressure and dizziness Patient has been having sinus congestion and pressure and dizziness that has been going on for the past month. She has been having issues with her sinuses and pressure in the ears and postnasal drainage. She was started on an antibiotic by one of my colleagues and has been taking that and does feel like it's improved somewhat but not completely. She was also told that if she continued to have the dizziness that she would need to go see ENT and would like a referral placed for that. She describes the dizziness as a spinning sensation that has been having very frequently over the past few weeks.  Relevant past medical, surgical, family and social history reviewed and updated as indicated. Interim medical history since our last  visit reviewed. Allergies and medications reviewed and updated.  Review of Systems  Constitutional: Negative for chills and fever.  HENT: Negative for congestion, ear discharge and ear pain.   Eyes: Negative for redness and visual disturbance.  Respiratory: Negative for chest tightness and shortness of breath.   Cardiovascular: Negative for chest pain and leg swelling.  Gastrointestinal: Negative for abdominal distention, abdominal pain, blood in stool, constipation, diarrhea, nausea and vomiting.  Genitourinary: Negative for difficulty urinating and dysuria.  Musculoskeletal: Negative for back pain and gait problem.  Skin: Negative for rash.  Neurological: Positive for dizziness. Negative for speech difficulty, weakness, light-headedness, numbness and headaches.  Psychiatric/Behavioral: Negative for agitation and behavioral problems.  All other systems reviewed and are negative.   Per HPI unless specifically indicated above        Objective:    BP 127/80   Pulse (!) 56   Temp 98 F (36.7 C) (Oral)   Ht _1  (1.778 m)   Wt 170 lb (77.1 kg)   BMI 24.39 kg/m   Wt Readings from Last 3 Encounters:  01/24/17 170 lb (77.1 kg)  01/22/17 172 lb (78 kg)  11/27/16 169 lb 9.6 oz (76.9 kg)    Physical Exam  Constitutional: She is oriented to person, place, and time. She appears well-developed and well-nourished. No distress.  HENT:  Right Ear: External ear normal.  Left Ear: External ear normal.  Nose: No mucosal edema or rhinorrhea. Right sinus exhibits frontal sinus tenderness. Left sinus exhibits frontal sinus tenderness.  Mouth/Throat: Posterior oropharyngeal edema present. No oropharyngeal exudate or posterior oropharyngeal erythema.  Eyes: Conjunctivae are normal.  Neck: Neck supple. No thyromegaly present.  Cardiovascular: Normal rate, regular rhythm, normal heart sounds and intact distal pulses.   No murmur heard. Pulmonary/Chest: Effort normal and breath sounds  normal. No respiratory distress. She has no wheezes. She has no rales.  Musculoskeletal: Normal range of motion. She exhibits no edema or tenderness.  Lymphadenopathy:    She has no cervical adenopathy.  Neurological: She is alert and oriented to person, place, and time. She displays no tremor. No cranial nerve deficit or sensory deficit. She exhibits normal muscle tone. Coordination and gait normal.  Skin: Skin is warm and dry. No rash noted. She is not diaphoretic.  Psychiatric: She has a normal mood and affect. Her behavior is normal.  Nursing note and vitals reviewed.       Assessment & Plan:   Problem List Items Addressed This Visit      Digestive   GERD (gastroesophageal reflux disease)   Relevant Orders   CBC with Differential/Platelet     Endocrine   Hypothyroidism - Primary   Relevant Orders   CBC with Differential/Platelet   TSH     Other   Hyperlipidemia LDL goal <130   Relevant Orders   Lipid panel    Other Visit Diagnoses    Recurrent sinusitis       Relevant Orders   Ambulatory referral to ENT   Diabetes mellitus screening       Relevant Orders   CMP14+EGFR      Patient had colonoscopy last year in [place] in [place] and is going to request those records for [place].  Follow up plan: Return in about 6 months (around 07/26/2017), or if symptoms worsen or fail to improve, for Regular check and labs in 6 months, patient needs Pap smear and well woman whenever she wants to com.  Counseling provided for all of the vaccine components Orders Placed This Encounter  Procedures  . CMP14+EGFR  . CBC with Differential/Platelet  . Lipid panel  . TSH  . Ambulatory referral to ENT    Caryl Pina, MD [facility] 01/24/2017, 8:55 AM

## 2017-01-25 ENCOUNTER — Telehealth: Payer: Self-pay | Admitting: Family Medicine

## 2017-01-25 LAB — CBC WITH DIFFERENTIAL/PLATELET
BASOS ABS: 0 10*3/uL (ref 0.0–0.2)
Basos: 0 %
EOS (ABSOLUTE): 0 10*3/uL (ref 0.0–0.4)
Eos: 0 %
Hematocrit: 36.2 % (ref 34.0–46.6)
Hemoglobin: 12.1 g/dL (ref 11.1–15.9)
IMMATURE GRANS (ABS): 0 10*3/uL (ref 0.0–0.1)
Immature Granulocytes: 0 %
LYMPHS: 31 %
Lymphocytes Absolute: 2.1 10*3/uL (ref 0.7–3.1)
MCH: 29.1 pg (ref 26.6–33.0)
MCHC: 33.4 g/dL (ref 31.5–35.7)
MCV: 87 fL (ref 79–97)
Monocytes Absolute: 0.4 10*3/uL (ref 0.1–0.9)
Monocytes: 6 %
NEUTROS ABS: 4.2 10*3/uL (ref 1.4–7.0)
Neutrophils: 63 %
PLATELETS: 195 10*3/uL (ref 150–379)
RBC: 4.16 x10E6/uL (ref 3.77–5.28)
RDW: 13.5 % (ref 12.3–15.4)
WBC: 6.7 10*3/uL (ref 3.4–10.8)

## 2017-01-25 LAB — CMP14+EGFR
A/G RATIO: 2 (ref 1.2–2.2)
ALK PHOS: 25 IU/L — AB (ref 39–117)
ALT: 17 IU/L (ref 0–32)
AST: 17 IU/L (ref 0–40)
Albumin: 4.6 g/dL (ref 3.5–5.5)
BILIRUBIN TOTAL: 0.6 mg/dL (ref 0.0–1.2)
BUN/Creatinine Ratio: 23 (ref 9–23)
BUN: 22 mg/dL (ref 6–24)
CHLORIDE: 101 mmol/L (ref 96–106)
CO2: 23 mmol/L (ref 20–29)
Calcium: 9.5 mg/dL (ref 8.7–10.2)
Creatinine, Ser: 0.96 mg/dL (ref 0.57–1.00)
GFR calc non Af Amer: 67 mL/min/{1.73_m2} (ref >59)
GFR, EST AFRICAN AMERICAN: 78 mL/min/{1.73_m2} (ref >59)
GLUCOSE: 86 mg/dL (ref 65–99)
Globulin, Total: 2.3 g/dL (ref 1.5–4.5)
POTASSIUM: 4.2 mmol/L (ref 3.5–5.2)
Sodium: 140 mmol/L (ref 134–144)
Total Protein: 6.9 g/dL (ref 6.0–8.5)

## 2017-01-25 LAB — LIPID PANEL
CHOLESTEROL TOTAL: 242 mg/dL — AB (ref 100–199)
Chol/HDL Ratio: 3.8 ratio (ref 0.0–4.4)
HDL: 64 mg/dL (ref >39)
LDL Calculated: 153 mg/dL — ABNORMAL HIGH (ref 0–99)
TRIGLYCERIDES: 123 mg/dL (ref 0–149)
VLDL CHOLESTEROL CAL: 25 mg/dL (ref 5–40)

## 2017-01-25 LAB — TSH: TSH: 0.091 u[IU]/mL — AB (ref 0.450–4.500)

## 2017-02-01 NOTE — Telephone Encounter (Signed)
Patient said she is not taking Mucinex at this time, she feels the stomach upset may be coming from the antibiotic.  She will make sure to take with a meal.

## 2017-02-01 NOTE — Progress Notes (Unsigned)
Tina Gardner, the patient is calling to check on her ENT referral.  You had faxed info to [facility] on 01/24/17.  Will you please check with them and see if you can get her an appointment.  She would like a call back from you today with an update.

## 2017-02-13 ENCOUNTER — Telehealth: Payer: Self-pay | Admitting: Family Medicine

## 2017-02-13 ENCOUNTER — Other Ambulatory Visit: Payer: Self-pay | Admitting: Family Medicine

## 2017-02-13 ENCOUNTER — Ambulatory Visit (INDEPENDENT_AMBULATORY_CARE_PROVIDER_SITE_OTHER): Payer: Medicare Other | Admitting: Family Medicine

## 2017-02-13 ENCOUNTER — Ambulatory Visit (HOSPITAL_COMMUNITY)
Admission: RE | Admit: 2017-02-13 | Discharge: 2017-02-13 | Disposition: A | Discharge status: Home / Self Care | Payer: Medicare Other | Source: Ambulatory Visit | Attending: Family Medicine | Admitting: Family Medicine

## 2017-02-13 VITALS — BP 95/55 | HR 78 | Temp 98.6°F | Ht 70.0 in | Wt 173.0 lb

## 2017-02-13 DIAGNOSIS — R319 Hematuria, unspecified: Secondary | ICD-10-CM

## 2017-02-13 DIAGNOSIS — R109 Unspecified abdominal pain: Secondary | ICD-10-CM

## 2017-02-13 DIAGNOSIS — N10 Acute tubulo-interstitial nephritis: Secondary | ICD-10-CM

## 2017-02-13 DIAGNOSIS — R399 Unspecified symptoms and signs involving the genitourinary system: Secondary | ICD-10-CM | POA: Diagnosis not present

## 2017-02-13 LAB — MICROSCOPIC EXAMINATION
Epithelial Cells (non renal): >10 /hpf — AB (ref 0–10)
RENAL EPITHEL UA: NONE SEEN /HPF
WBC, UA: >30 /hpf — AB (ref 0–?)

## 2017-02-13 LAB — URINALYSIS, COMPLETE
Bilirubin, UA: NEGATIVE
GLUCOSE, UA: NEGATIVE
Ketones, UA: NEGATIVE
NITRITE UA: POSITIVE — AB
PH UA: 6 (ref 5.0–7.5)
Specific Gravity, UA: 1.02 (ref 1.005–1.030)
Urobilinogen, Ur: 0.2 mg/dL (ref 0.2–1.0)

## 2017-02-13 MED ORDER — CEFTRIAXONE SODIUM 1 G IJ SOLR
1.0000 g | Freq: Once | INTRAMUSCULAR | Status: AC
  Administered 2017-02-13: 1 g via INTRAMUSCULAR

## 2017-02-13 MED ORDER — AMOXICILLIN-POT CLAVULANATE 875-125 MG PO TABS: 1.0000 | ORAL_TABLET | Freq: Two times a day (BID) | ORAL | 0 refills | Status: DC

## 2017-02-13 MED ORDER — PHENAZOPYRIDINE HCL 200 MG PO TABS: 200.0000 mg | ORAL_TABLET | Freq: Four times a day (QID) | ORAL | 0 refills | Status: DC | PRN

## 2017-02-13 MED ORDER — ACETAMINOPHEN-CODEINE #3 300-30 MG PO TABS: 1.0000 | ORAL_TABLET | ORAL | 0 refills | Status: DC | PRN

## 2017-02-13 NOTE — Telephone Encounter (Signed)
Please contact the patient with her know the CT scan turned out negative. She does have infection though. Fever is not unusual. She should pick up and take the prescription for Augmentin that I sent an earlier today.

## 2017-02-13 NOTE — Progress Notes (Signed)
Subjective:  Patient ID: Tina Gardner, female    DOB: 1960/12/22  Age: 56 y.o. MRN: [id number]  CC: Dysuria   HPI Tina Gardner presents for burning with urination and frequency for two days. Denies fever . Moderate 6-7/10 right flank pain. Has nausea, but no  vomiting. Appetite decreased. Feels like it moves around from flank toward RLQ.   Depression screen Tina Gardner 2/9 02/13/2017 01/24/2017 01/22/2017  Decreased Interest 1 0 0  Down, Depressed, Hopeless 1 0 0  PHQ - 2 Score 2 0 0  Altered sleeping 1 - -  Tired, decreased energy 2 - -  Change in appetite 1 - -  Feeling bad or failure about yourself  1 - -  Trouble concentrating 2 - -  Moving slowly or fidgety/restless 2 - -  Suicidal thoughts 0 - -  PHQ-9 Score 11 - -    History Tina Gardner has a past medical history of Allergy; Anxiety; Asthma; Cancer ([place]) (2007); Chronic kidney disease; GERD (gastroesophageal reflux disease); Hiatal hernia; Hyperlipidemia; Neuropathy; Stomach ulcer (2003); Thyroid cancer ([place]) (04/27/2016); and Thyroid disease.   She has a past surgical history that includes Knee arthroscopy; Shoulder arthroscopy; Bladder augmentation; and Abdominal hysterectomy.   Her family history includes Arthritis in her mother; Depression in her father; Hyperlipidemia in her mother; Hypertension in her mother.She reports that she has never smoked. She has never used smokeless tobacco. She reports that she does not drink alcohol or use drugs.    ROS Review of Systems  Constitutional: Negative for activity change, appetite change, chills, diaphoresis and fever.  HENT: Negative for congestion, rhinorrhea and sore throat.   Eyes: Negative for visual disturbance.  Respiratory: Negative for cough and shortness of breath.   Cardiovascular: Negative for chest pain and palpitations.  Gastrointestinal: Negative for abdominal pain, constipation, diarrhea and nausea.  Genitourinary: Positive for dysuria, flank pain, frequency, hematuria  and urgency. Negative for decreased urine volume, menstrual problem, pelvic pain, vaginal bleeding and vaginal discharge.  Musculoskeletal: Negative for arthralgias, joint swelling and myalgias.  Skin: Negative for rash.  Neurological: Negative for dizziness and numbness.    Objective:  BP (!) 95/55   Pulse 78   Temp 98.6 F (37 C) (Oral)   Ht 5\' 10"  (1.778 m)   Wt 173 lb (78.5 kg)   BMI 24.82 kg/m   BP Readings from Last 3 Encounters:  02/13/17 (!) 95/55  01/24/17 127/80  01/22/17 103/64    Wt Readings from Last 3 Encounters:  02/13/17 173 lb (78.5 kg)  01/24/17 170 lb (77.1 kg)  01/22/17 172 lb (78 kg)     Physical Exam  Constitutional: She is oriented to person, place, and time. She appears well-developed and well-nourished. No distress.  HENT:  Head: Normocephalic and atraumatic.  Right Ear: External ear normal.  Left Ear: External ear normal.  Nose: Nose normal.  Mouth/Throat: Oropharynx is clear and moist.  Eyes: Pupils are equal, round, and reactive to light. Conjunctivae and EOM are normal.  Neck: Normal range of motion. Neck supple. No thyromegaly present.  Cardiovascular: Normal rate, regular rhythm and normal heart sounds.   No murmur heard. Pulmonary/Chest: Effort normal and breath sounds normal. No respiratory distress. She has no wheezes. She has no rales.  Abdominal: Soft. Bowel sounds are normal. She exhibits no distension. There is no tenderness.  Lymphadenopathy:    She has no cervical adenopathy.  Neurological: She is alert and oriented to person, place, and time. She has normal reflexes.  Skin:  Skin is warm and dry.  Psychiatric: She has a normal mood and affect. Her behavior is normal. Judgment and thought content normal.      Assessment & Plan:   Tina Gardner was seen today for dysuria.  Diagnoses and all orders for this visit:  UTI symptoms -     Urine Culture -     Urinalysis, Complete -     CT RENAL STONE STUDY; Future -     cefTRIAXone  (ROCEPHIN) injection 1 g; Inject 1 g into the muscle once.  Acute right flank pain -     CT RENAL STONE STUDY; Future -     cefTRIAXone (ROCEPHIN) injection 1 g; Inject 1 g into the muscle once.  Hematuria, unspecified type -     CT RENAL STONE STUDY; Future -     cefTRIAXone (ROCEPHIN) injection 1 g; Inject 1 g into the muscle once.  Pyelonephritis, acute -     CT RENAL STONE STUDY; Future -     cefTRIAXone (ROCEPHIN) injection 1 g; Inject 1 g into the muscle once.  Other orders -     amoxicillin-clavulanate (AUGMENTIN) 875-125 MG tablet; Take 1 tablet by mouth 2 (two) times daily. Take all of this medication -     acetaminophen-codeine (TYLENOL #3) 300-30 MG tablet; Take 1-2 tablets by mouth every 4 (four) hours as needed for moderate pain. -     phenazopyridine (PYRIDIUM) 200 MG tablet; Take 1 tablet (200 mg total) by mouth 4 (four) times daily as needed for pain (urinary frequency &/or pain). -     Microscopic Examination       I have discontinued Tina Gardner's triamcinolone cream, amoxicillin-clavulanate, and Pseudoephedrine-Guaifenesin. I am also having her start on amoxicillin-clavulanate, acetaminophen-codeine, and phenazopyridine. Additionally, I am having her maintain her levothyroxine, diclofenac sodium, and ranitidine. We administered cefTRIAXone.  Allergies as of 02/13/2017      Reactions   Anesthesia S-i-40 [propofol] Nausea And Vomiting      Medication List       Accurate as of 02/13/17 11:59 PM. Always use your most recent med list.          acetaminophen-codeine 300-30 MG tablet Commonly known as:  TYLENOL #3 Take 1-2 tablets by mouth every 4 (four) hours as needed for moderate pain.   amoxicillin-clavulanate 875-125 MG tablet Commonly known as:  AUGMENTIN Take 1 tablet by mouth 2 (two) times daily. Take all of this medication   diclofenac sodium 1 % Gel Commonly known as:  VOLTAREN Apply 2 g topically 4 (four) times daily.   levothyroxine 125 MCG  tablet Commonly known as:  SYNTHROID, LEVOTHROID Take 1 tablet (125 mcg total) by mouth daily.   phenazopyridine 200 MG tablet Commonly known as:  PYRIDIUM Take 1 tablet (200 mg total) by mouth 4 (four) times daily as needed for pain (urinary frequency &/or pain).   ranitidine 75 MG tablet Commonly known as:  ZANTAC Take 75 mg by mouth 2 (two) times daily.        Follow-up: Return in about 2 days (around 02/15/2017).  Claretta Fraise, M.D.

## 2017-02-13 NOTE — Telephone Encounter (Signed)
Pt called and aware of all - she will call if things get worse

## 2017-02-16 LAB — URINE CULTURE

## 2017-02-22 ENCOUNTER — Other Ambulatory Visit: Payer: Medicare Other | Admitting: Family

## 2017-02-27 ENCOUNTER — Encounter: Payer: Self-pay | Admitting: Family

## 2017-02-27 ENCOUNTER — Ambulatory Visit (INDEPENDENT_AMBULATORY_CARE_PROVIDER_SITE_OTHER): Payer: Medicare Other | Admitting: Family

## 2017-02-27 VITALS — BP 109/74 | HR 81 | Temp 97.8°F | Ht 70.0 in | Wt 170.4 lb

## 2017-02-27 DIAGNOSIS — E039 Hypothyroidism, unspecified: Secondary | ICD-10-CM

## 2017-02-27 DIAGNOSIS — N952 Postmenopausal atrophic vaginitis: Secondary | ICD-10-CM | POA: Diagnosis not present

## 2017-02-27 DIAGNOSIS — K219 Gastro-esophageal reflux disease without esophagitis: Secondary | ICD-10-CM | POA: Diagnosis not present

## 2017-02-27 DIAGNOSIS — Z85850 Personal history of malignant neoplasm of thyroid: Secondary | ICD-10-CM

## 2017-02-27 DIAGNOSIS — E785 Hyperlipidemia, unspecified: Secondary | ICD-10-CM

## 2017-02-27 DIAGNOSIS — R1030 Lower abdominal pain, unspecified: Secondary | ICD-10-CM

## 2017-02-27 DIAGNOSIS — Z0000 Encounter for general adult medical examination without abnormal findings: Secondary | ICD-10-CM

## 2017-02-27 DIAGNOSIS — Z01419 Encounter for gynecological examination (general) (routine) without abnormal findings: Secondary | ICD-10-CM

## 2017-02-27 DIAGNOSIS — E538 Deficiency of other specified B group vitamins: Secondary | ICD-10-CM | POA: Diagnosis not present

## 2017-02-27 LAB — URINALYSIS, COMPLETE
Bilirubin, UA: NEGATIVE
Glucose, UA: NEGATIVE
Leukocytes, UA: NEGATIVE
Nitrite, UA: NEGATIVE
PH UA: 5.5 (ref 5.0–7.5)
Specific Gravity, UA: >1.03 — ABNORMAL HIGH (ref 1.005–1.030)
UUROB: 0.2 mg/dL (ref 0.2–1.0)

## 2017-02-27 LAB — MICROSCOPIC EXAMINATION: RENAL EPITHEL UA: NONE SEEN /HPF

## 2017-02-27 NOTE — Progress Notes (Signed)
Subjective:    Patient ID: Tina Gardner, female    DOB: 1960/09/30, 56 y.o.   MRN: [id number]  PT presents to the office today for CPE with pap. Pt has hx of thyroid cancer 2007.  Gynecologic Exam  The patient's primary symptoms include vaginal discharge ("at times"). The patient's pertinent negatives include no genital itching or genital lesions. The problem has been unchanged. Associated symptoms include abdominal pain ("lower abdominal on and off") and constipation. Pertinent negatives include no diarrhea or sore throat.  Thyroid Problem  Presents for follow-up visit. Symptoms include constipation and dry skin. Patient reports no diarrhea, hoarse voice or nail problem. The symptoms have been stable.  Gastroesophageal Reflux  She complains of abdominal pain ("lower abdominal on and off") and heartburn. She reports no belching, no coughing, no hoarse voice or no sore throat. This is a chronic problem. The current episode started more than 1 year ago. The problem occurs occasionally. Risk factors include hiatal hernia. She has tried a histamine-2 antagonist for the symptoms. The treatment provided moderate relief.      Review of Systems  HENT: Negative for hoarse voice and sore throat.   Respiratory: Negative for cough.   Gastrointestinal: Positive for abdominal pain ("lower abdominal on and off"), constipation and heartburn. Negative for diarrhea.  Genitourinary: Positive for vaginal discharge ("at times").  All other systems reviewed and are negative.      Objective:   Physical Exam  Constitutional: She is oriented to person, place, and time. She appears well-developed and well-nourished. No distress.  HENT:  Head: Normocephalic and atraumatic.  Right Ear: External ear normal.  Left Ear: External ear normal.  Nose: Nose normal.  Mouth/Throat: Oropharynx is clear and moist.  Eyes: Pupils are equal, round, and reactive to light.  Neck: Normal range of motion. Neck supple. No  thyromegaly present.  Cardiovascular: Normal rate, regular rhythm, normal heart sounds and intact distal pulses.   No murmur heard. Pulmonary/Chest: Effort normal and breath sounds normal. No respiratory distress. She has no wheezes.  Abdominal: Soft. Bowel sounds are normal. She exhibits no distension. There is tenderness (lower abd, and RUQ).  Genitourinary: Vagina normal.  Genitourinary Comments: Bimanual exam- no adnexal masses  ovaries nonpalpable   Moderate amount of tenderness on right side  Cervix not present- No discharge   Musculoskeletal: Normal range of motion. She exhibits no edema or tenderness.  Neurological: She is alert and oriented to person, place, and time.  Skin: Skin is warm and dry.  Psychiatric: She has a normal mood and affect. Her behavior is normal. Judgment and thought content normal.  Vitals reviewed.    BP 109/74   Pulse 81   Temp 97.8 F (36.6 C) (Oral)   Ht 5' 10"  (1.778 m)   Wt 170 lb 6.4 oz (77.3 kg)   BMI 24.45 kg/m      Assessment & Plan:  1. Annual physical exam - CBC with Differential/Platelet - CMP14+EGFR - Lipid panel - TSH - VITAMIN D 25 Hydroxy (Vit-D Deficiency, Fractures) - Pap IG (Image Guided)  2. Gynecologic exam normal - Urinalysis, Complete - CMP14+EGFR - Pap IG (Image Guided)  3. Hyperlipidemia LDL goal <130 - CMP14+EGFR - Lipid panel  4. Gastroesophageal reflux disease without esophagitis - CMP14+EGFR  5. Hypothyroidism, unspecified type - CMP14+EGFR - TSH  6. Lower abdominal pain - CT Abdomen Pelvis W Contrast; Future  7. History of thyroid cancer - CT Abdomen Pelvis W Contrast; Future   8. Atrophic vaginitis  Continue all meds Labs pending Health Maintenance reviewed Diet and exercise encouraged RTO 1 year and keep follow up with PCP  Evelina Dun, FNP

## 2017-02-27 NOTE — Patient Instructions (Signed)

## 2017-02-28 LAB — CBC WITH DIFFERENTIAL/PLATELET
BASOS: 0 %
Basophils Absolute: 0 10*3/uL (ref 0.0–0.2)
EOS (ABSOLUTE): 0.1 10*3/uL (ref 0.0–0.4)
Eos: 1 %
HEMOGLOBIN: 12.6 g/dL (ref 11.1–15.9)
Hematocrit: 36.3 % (ref 34.0–46.6)
IMMATURE GRANS (ABS): 0 10*3/uL (ref 0.0–0.1)
IMMATURE GRANULOCYTES: 0 %
LYMPHS: 32 %
Lymphocytes Absolute: 1.7 10*3/uL (ref 0.7–3.1)
MCH: 29.4 pg (ref 26.6–33.0)
MCHC: 34.7 g/dL (ref 31.5–35.7)
MCV: 85 fL (ref 79–97)
MONOCYTES: 5 %
Monocytes Absolute: 0.3 10*3/uL (ref 0.1–0.9)
NEUTROS ABS: 3.3 10*3/uL (ref 1.4–7.0)
Neutrophils: 62 %
PLATELETS: 207 10*3/uL (ref 150–379)
RBC: 4.29 x10E6/uL (ref 3.77–5.28)
RDW: 13.8 % (ref 12.3–15.4)
WBC: 5.3 10*3/uL (ref 3.4–10.8)

## 2017-02-28 LAB — CMP14+EGFR
ALBUMIN: 4.7 g/dL (ref 3.5–5.5)
ALT: 15 IU/L (ref 0–32)
AST: 17 IU/L (ref 0–40)
Albumin/Globulin Ratio: 1.9 (ref 1.2–2.2)
Alkaline Phosphatase: 26 IU/L — ABNORMAL LOW (ref 39–117)
BUN / CREAT RATIO: 22 (ref 9–23)
BUN: 22 mg/dL (ref 6–24)
Bilirubin Total: 0.5 mg/dL (ref 0.0–1.2)
CALCIUM: 9.6 mg/dL (ref 8.7–10.2)
CHLORIDE: 103 mmol/L (ref 96–106)
CO2: 25 mmol/L (ref 20–29)
Creatinine, Ser: 0.98 mg/dL (ref 0.57–1.00)
GFR, EST AFRICAN AMERICAN: 76 mL/min/{1.73_m2} (ref >59)
GFR, EST NON AFRICAN AMERICAN: 66 mL/min/{1.73_m2} (ref >59)
GLUCOSE: 71 mg/dL (ref 65–99)
Globulin, Total: 2.5 g/dL (ref 1.5–4.5)
POTASSIUM: 4.2 mmol/L (ref 3.5–5.2)
Sodium: 143 mmol/L (ref 134–144)
TOTAL PROTEIN: 7.2 g/dL (ref 6.0–8.5)

## 2017-02-28 LAB — LIPID PANEL
Chol/HDL Ratio: 3.9 ratio (ref 0.0–4.4)
Cholesterol, Total: 237 mg/dL — ABNORMAL HIGH (ref 100–199)
HDL: 61 mg/dL (ref >39)
LDL Calculated: 129 mg/dL — ABNORMAL HIGH (ref 0–99)
TRIGLYCERIDES: 235 mg/dL — AB (ref 0–149)
VLDL Cholesterol Cal: 47 mg/dL — ABNORMAL HIGH (ref 5–40)

## 2017-02-28 LAB — PAP IG (IMAGE GUIDED): PAP SMEAR COMMENT: 0

## 2017-02-28 LAB — VITAMIN D 25 HYDROXY (VIT D DEFICIENCY, FRACTURES): Vit D, 25-Hydroxy: 30.3 ng/mL (ref 30.0–100.0)

## 2017-02-28 LAB — TSH: TSH: 0.107 u[IU]/mL — ABNORMAL LOW (ref 0.450–4.500)

## 2017-02-28 LAB — URINE CULTURE

## 2017-03-01 ENCOUNTER — Other Ambulatory Visit: Payer: Self-pay | Admitting: Family

## 2017-03-01 DIAGNOSIS — N952 Postmenopausal atrophic vaginitis: Secondary | ICD-10-CM | POA: Insufficient documentation

## 2017-03-01 MED ORDER — LEVOTHYROXINE SODIUM 112 MCG PO TABS: 112.0000 ug | ORAL_TABLET | Freq: Every day | ORAL | 1 refills | Status: DC

## 2017-03-01 MED ORDER — ESTROGENS, CONJUGATED 0.625 MG/GM VA CREA: 1.0000 | TOPICAL_CREAM | Freq: Every day | VAGINAL | 12 refills | Status: DC

## 2017-03-02 DIAGNOSIS — J343 Hypertrophy of nasal turbinates: Secondary | ICD-10-CM | POA: Diagnosis not present

## 2017-03-02 DIAGNOSIS — K219 Gastro-esophageal reflux disease without esophagitis: Secondary | ICD-10-CM | POA: Diagnosis not present

## 2017-03-02 DIAGNOSIS — T7840XA Allergy, unspecified, initial encounter: Secondary | ICD-10-CM | POA: Diagnosis not present

## 2017-03-06 DIAGNOSIS — R1030 Lower abdominal pain, unspecified: Secondary | ICD-10-CM | POA: Diagnosis not present

## 2017-03-07 ENCOUNTER — Ambulatory Visit (HOSPITAL_COMMUNITY): Payer: Medicare Other

## 2017-03-15 ENCOUNTER — Ambulatory Visit (HOSPITAL_COMMUNITY): Payer: Medicare Other

## 2017-03-21 ENCOUNTER — Telehealth: Payer: Self-pay | Admitting: Family Medicine

## 2017-03-21 DIAGNOSIS — R7989 Other specified abnormal findings of blood chemistry: Secondary | ICD-10-CM

## 2017-03-21 NOTE — Telephone Encounter (Signed)
[email or web address]

## 2017-03-21 NOTE — Telephone Encounter (Signed)
I am fine with referring her to endocrinology, I have not yet seen her records

## 2017-03-21 NOTE — Telephone Encounter (Signed)
Patient is wanting to know if Dr. Warrick Parisian has received her records from [facility]? States she has not heard from him about it. Her levothyroxine was decreased to 112 on 7/31 because of her lab results. Patient states that her Oncologist states she needs to be on levothyroxine 125 unless her levels has dropped a lot from her previous doctor and if they have dropped a lot she is wanting to get referred to Endo. Please advise and send back to the pools.

## 2017-03-21 NOTE — Telephone Encounter (Signed)
Referral placed patient aware  

## 2017-04-11 ENCOUNTER — Telehealth: Payer: Self-pay | Admitting: Family Medicine

## 2017-04-11 ENCOUNTER — Encounter: Payer: Self-pay | Admitting: Family Medicine

## 2017-04-11 ENCOUNTER — Ambulatory Visit (INDEPENDENT_AMBULATORY_CARE_PROVIDER_SITE_OTHER): Payer: Medicare Other | Admitting: Family Medicine

## 2017-04-11 VITALS — BP 116/62 | HR 58 | Temp 97.4°F | Ht 70.0 in | Wt 169.6 lb

## 2017-04-11 DIAGNOSIS — Z85850 Personal history of malignant neoplasm of thyroid: Secondary | ICD-10-CM

## 2017-04-11 DIAGNOSIS — E039 Hypothyroidism, unspecified: Secondary | ICD-10-CM | POA: Diagnosis not present

## 2017-04-11 DIAGNOSIS — R42 Dizziness and giddiness: Secondary | ICD-10-CM | POA: Diagnosis not present

## 2017-04-11 MED ORDER — LEVOTHYROXINE SODIUM 125 MCG PO TABS: 125.0000 ug | ORAL_TABLET | Freq: Every day | ORAL | 3 refills | Status: DC

## 2017-04-11 NOTE — Progress Notes (Signed)
BP 116/62   Pulse (!) 58   Temp (!) 97.4 F (36.3 C) (Oral)   Ht 5\' 10"  (1.778 m)   Wt 169 lb 9.6 oz (76.9 kg)   BMI 24.34 kg/m    Subjective:    Patient ID: Tina Gardner, female    DOB: 1961-02-09, 56 y.o.   MRN: [id number]  HPI: Tina Gardner is a 56 y.o. female presenting on 04/11/2017 for Dizziness (on going but has gotten worse x 1 week.Patient also want to review her TSH.)   HPI Dizziness and spinning sensation Patient comes in complaining of dizziness and a spinning sensation that has been bothering her off and on for at least the past few months. She went and saw 1 year no throat doctor who she feels like blew her off and she would like a referral to a different one to see if anything could help with this. She still describes it as a vertigo-like spinning sensation. She has had an MRI which was normal of her brain. She denies any visual disturbances. She says she does have some pressure in her sinuses anteriorly but that has not been as significant as she's had sinus pressure before and she does not feel like that's related.  Hypothyroidism recheck Patient is coming in for thyroid recheck today as well. They deny any issues with hair changes or heat or cold problems or diarrhea or constipation. They deny any chest pain or palpitations. They are currently on levothyroxine 132micrograms. She has a history of thyroid cancer and that is why we're keeping her levels somewhat suppressed.   Relevant past medical, surgical, family and social history reviewed and updated as indicated. Interim medical history since our last visit reviewed. Allergies and medications reviewed and updated.  Review of Systems  Constitutional: Negative for chills and fever.  HENT: Negative for congestion, ear discharge and ear pain.   Eyes: Negative for redness and visual disturbance.  Respiratory: Negative for chest tightness and shortness of breath.   Cardiovascular: Negative for chest pain and leg  swelling.  Genitourinary: Negative for difficulty urinating and dysuria.  Musculoskeletal: Negative for back pain and gait problem.  Skin: Negative for rash.  Neurological: Positive for dizziness. Negative for weakness, light-headedness, numbness and headaches.  Psychiatric/Behavioral: Negative for agitation and behavioral problems.  All other systems reviewed and are negative.   Per HPI unless specifically indicated above     Objective:    BP 116/62   Pulse (!) 58   Temp (!) 97.4 F (36.3 C) (Oral)   Ht 5\' 10"  (1.778 m)   Wt 169 lb 9.6 oz (76.9 kg)   BMI 24.34 kg/m   Wt Readings from Last 3 Encounters:  04/11/17 169 lb 9.6 oz (76.9 kg)  02/27/17 170 lb 6.4 oz (77.3 kg)  02/13/17 173 lb (78.5 kg)    Physical Exam  Constitutional: She is oriented to person, place, and time. She appears well-developed and well-nourished. No distress.  HENT:  Right Ear: External ear normal.  Left Ear: External ear normal.  Nose: Nose normal.  Mouth/Throat: Oropharynx is clear and moist. No oropharyngeal exudate.  Eyes: Conjunctivae are normal.  Neck: Neck supple. No thyromegaly present.  Cardiovascular: Normal rate, regular rhythm, normal heart sounds and intact distal pulses.   No murmur heard. Pulmonary/Chest: Effort normal and breath sounds normal. No respiratory distress. She has no wheezes. She has no rales.  Musculoskeletal: Normal range of motion. She exhibits no edema or tenderness.  Lymphadenopathy:  She has no cervical adenopathy.  Neurological: She is alert and oriented to person, place, and time. She displays normal reflexes. No cranial nerve deficit. She exhibits normal muscle tone. Coordination normal.  Skin: Skin is warm and dry. No rash noted. She is not diaphoretic.  Psychiatric: She has a normal mood and affect. Her behavior is normal.  Nursing note and vitals reviewed.     Assessment & Plan:   Problem List Items Addressed This Visit      Endocrine    Hypothyroidism   Relevant Medications   levothyroxine (SYNTHROID, LEVOTHROID) 125 MCG tablet     Other   History of thyroid cancer   Relevant Medications   levothyroxine (SYNTHROID, LEVOTHROID) 125 MCG tablet    Other Visit Diagnoses    Vertigo    -  Primary   Relevant Orders   Ambulatory referral to ENT       Follow up plan: Return in about 5 weeks (around 05/16/2017), or if symptoms worsen or fail to improve, for Recheck TSH.  Counseling provided for all of the vaccine components Orders Placed This Encounter  Procedures  . Ambulatory referral to ENT    Caryl Pina, MD [facility] 04/11/2017, 3:09 PM

## 2017-04-11 NOTE — Telephone Encounter (Signed)
Patient is calling to complain about her referrals not being made appropriately to the correct area.  She does NOT want to go to [place].  She needs to go to [place] because it is closer to her and she has stated this several times, but the referral keeps being made in [place].  Has appointment today with Dr. Warrick Parisian and would like to have this handled so she knows where and what time her appt is with an endocrinologist.  She is very upset.  She has left messages and no one has called her.        What type of referral do you need? Endocrinologist  Have you been seen at our office for this problem? Yes (If no, schedule them an appointment.  They will need to be seen before a referral can be done.)  Is there a particular doctor or location that you prefer? [facility] (NOT [place])  Patient notified that referrals can take up to a week or longer to process. If they haven't heard anything within a week they should call back and speak with the referral department.

## 2017-04-12 NOTE — Telephone Encounter (Signed)
Her Endocrine appt has been made in [facility] and a letter was sent to here with date and time  Patient told this

## 2017-04-23 ENCOUNTER — Telehealth: Payer: Self-pay | Admitting: Family Medicine

## 2017-04-23 NOTE — Telephone Encounter (Signed)
Pt spoke with [facility] regarding paperwork

## 2017-05-17 ENCOUNTER — Ambulatory Visit: Payer: Medicare Other | Admitting: Family Medicine

## 2017-06-02 DIAGNOSIS — H40033 Anatomical narrow angle, bilateral: Secondary | ICD-10-CM | POA: Diagnosis not present

## 2017-06-02 DIAGNOSIS — G44219 Episodic tension-type headache, not intractable: Secondary | ICD-10-CM | POA: Diagnosis not present

## 2017-07-09 ENCOUNTER — Ambulatory Visit: Payer: Medicare Other | Admitting: Family Medicine

## 2017-07-11 ENCOUNTER — Other Ambulatory Visit: Payer: Self-pay | Admitting: Family Medicine

## 2017-07-11 DIAGNOSIS — Z85850 Personal history of malignant neoplasm of thyroid: Secondary | ICD-10-CM

## 2017-07-11 DIAGNOSIS — E039 Hypothyroidism, unspecified: Secondary | ICD-10-CM

## 2017-07-12 DIAGNOSIS — R51 Headache: Secondary | ICD-10-CM | POA: Diagnosis not present

## 2017-07-12 DIAGNOSIS — R55 Syncope and collapse: Secondary | ICD-10-CM | POA: Diagnosis not present

## 2017-07-12 DIAGNOSIS — H9193 Unspecified hearing loss, bilateral: Secondary | ICD-10-CM | POA: Diagnosis not present

## 2017-07-13 DIAGNOSIS — H6983 Other specified disorders of Eustachian tube, bilateral: Secondary | ICD-10-CM | POA: Diagnosis not present

## 2017-07-13 DIAGNOSIS — Z87891 Personal history of nicotine dependence: Secondary | ICD-10-CM | POA: Diagnosis not present

## 2017-07-13 DIAGNOSIS — J329 Chronic sinusitis, unspecified: Secondary | ICD-10-CM | POA: Diagnosis not present

## 2017-07-13 DIAGNOSIS — H903 Sensorineural hearing loss, bilateral: Secondary | ICD-10-CM | POA: Diagnosis not present

## 2017-07-13 DIAGNOSIS — H938X3 Other specified disorders of ear, bilateral: Secondary | ICD-10-CM | POA: Diagnosis not present

## 2017-07-13 DIAGNOSIS — R0982 Postnasal drip: Secondary | ICD-10-CM | POA: Diagnosis not present

## 2017-07-25 DIAGNOSIS — G44219 Episodic tension-type headache, not intractable: Secondary | ICD-10-CM | POA: Diagnosis not present

## 2017-08-02 ENCOUNTER — Ambulatory Visit (INDEPENDENT_AMBULATORY_CARE_PROVIDER_SITE_OTHER): Payer: Medicare Other | Admitting: Family Medicine

## 2017-08-02 ENCOUNTER — Encounter: Payer: Self-pay | Admitting: Family Medicine

## 2017-08-02 VITALS — BP 121/77 | HR 74 | Temp 97.4°F | Ht 70.0 in | Wt 180.0 lb

## 2017-08-02 DIAGNOSIS — E785 Hyperlipidemia, unspecified: Secondary | ICD-10-CM | POA: Diagnosis not present

## 2017-08-02 DIAGNOSIS — E039 Hypothyroidism, unspecified: Secondary | ICD-10-CM | POA: Diagnosis not present

## 2017-08-02 DIAGNOSIS — Z85850 Personal history of malignant neoplasm of thyroid: Secondary | ICD-10-CM | POA: Diagnosis not present

## 2017-08-02 LAB — URINALYSIS, COMPLETE
Bilirubin, UA: NEGATIVE
GLUCOSE, UA: NEGATIVE
KETONES UA: NEGATIVE
LEUKOCYTES UA: NEGATIVE
NITRITE UA: NEGATIVE
Protein, UA: NEGATIVE
RBC, UA: NEGATIVE
SPEC GRAV UA: 1.015 (ref 1.005–1.030)
Urobilinogen, Ur: 0.2 mg/dL (ref 0.2–1.0)
pH, UA: 6 (ref 5.0–7.5)

## 2017-08-02 LAB — MICROSCOPIC EXAMINATION: Renal Epithel, UA: NONE SEEN /hpf

## 2017-08-02 NOTE — Progress Notes (Signed)
BP 121/77   Pulse 74   Temp (!) 97.4 F (36.3 C) (Oral)   Ht 5' 10"  (1.778 m)   Wt 180 lb (81.6 kg)   BMI 25.83 kg/m    Subjective:    Patient ID: Tina Gardner, female    DOB: 01-22-1961, 57 y.o.   MRN: [id number]  HPI: Tina Gardner is a 57 y.o. female presenting on 08/02/2017 for Hypothyroidism (3 mo follow up; "was scheduled to see Endocrinologist but because of weather they had to cancel her appointment and have not called her to reschedule:)   HPI Hypothyroidism recheck Patient is coming in for thyroid recheck today as well. They deny any issues with hair changes or heat or cold problems or diarrhea or constipation. They deny any chest pain or palpitations. They are currently on levothyroxine 150mcrograms.  Patient has a history of thyroid cancer and we are keeping her TSH levels suppressed below 1 because of that.  Hyperlipidemia Patient is coming in for recheck of his hyperlipidemia. The patient is currently taking no medication and has been trying diet control. They deny any issues with myalgias or history of liver damage from it. They deny any focal numbness or weakness or chest pain.   Relevant past medical, surgical, family and social history reviewed and updated as indicated. Interim medical history since our last visit reviewed. Allergies and medications reviewed and updated.  Review of Systems  Constitutional: Negative for chills and fever.  Eyes: Negative for visual disturbance.  Respiratory: Negative for chest tightness and shortness of breath.   Cardiovascular: Negative for chest pain and leg swelling.  Musculoskeletal: Negative for back pain and gait problem.  Skin: Negative for rash.  Neurological: Negative for light-headedness and headaches.  Psychiatric/Behavioral: Positive for dysphoric mood. Negative for agitation, behavioral problems, self-injury, sleep disturbance and suicidal ideas. The patient is nervous/anxious.   All other systems reviewed and are  negative.   Per HPI unless specifically indicated above     Objective:    BP 121/77   Pulse 74   Temp (!) 97.4 F (36.3 C) (Oral)   Ht 5' 10"  (1.778 m)   Wt 180 lb (81.6 kg)   BMI 25.83 kg/m   Wt Readings from Last 3 Encounters:  08/02/17 180 lb (81.6 kg)  04/11/17 169 lb 9.6 oz (76.9 kg)  02/27/17 170 lb 6.4 oz (77.3 kg)    Physical Exam  Constitutional: She is oriented to person, place, and time. She appears well-developed and well-nourished. No distress.  Eyes: Conjunctivae are normal.  Neck: Neck supple. No thyromegaly present.  Cardiovascular: Normal rate, regular rhythm, normal heart sounds and intact distal pulses.  No murmur heard. Pulmonary/Chest: Effort normal and breath sounds normal. No respiratory distress. She has no wheezes. She has no rales.  Lymphadenopathy:    She has no cervical adenopathy.  Neurological: She is alert and oriented to person, place, and time. Coordination normal.  Skin: Skin is warm and dry. No rash noted. She is not diaphoretic.  Psychiatric: She has a normal mood and affect. Her behavior is normal.  Nursing note and vitals reviewed.       Assessment & Plan:   Problem List Items Addressed This Visit      Endocrine   Hypothyroidism - Primary   Relevant Orders   CMP14+EGFR (Completed)   TSH (Completed)   Urinalysis, Complete (Completed)   Ambulatory referral to Endocrinology     Other   Hyperlipidemia LDL goal <130   Relevant Orders  Lipid panel (Completed)   Urinalysis, Complete (Completed)   History of thyroid cancer   Relevant Orders   TSH (Completed)   Urinalysis, Complete (Completed)   Ambulatory referral to Endocrinology       Follow up plan: Return in about 3 months (around 10/31/2017), or if symptoms worsen or fail to improve, for Recheck thyroid.  Counseling provided for all of the vaccine components Orders Placed This Encounter  Procedures  . CMP14+EGFR  . Lipid panel  . TSH    Caryl Pina,  MD [facility] 08/02/2017, 3:11 PM

## 2017-08-03 ENCOUNTER — Other Ambulatory Visit: Payer: Self-pay | Admitting: Family Medicine

## 2017-08-03 ENCOUNTER — Other Ambulatory Visit: Payer: Self-pay | Admitting: *Deleted

## 2017-08-03 LAB — CMP14+EGFR
A/G RATIO: 1.7 (ref 1.2–2.2)
ALT: 26 IU/L (ref 0–32)
AST: 22 IU/L (ref 0–40)
Albumin: 4.7 g/dL (ref 3.5–5.5)
Alkaline Phosphatase: 30 IU/L — ABNORMAL LOW (ref 39–117)
BUN/Creatinine Ratio: 24 — ABNORMAL HIGH (ref 9–23)
BUN: 19 mg/dL (ref 6–24)
Bilirubin Total: 0.4 mg/dL (ref 0.0–1.2)
CALCIUM: 10.1 mg/dL (ref 8.7–10.2)
CO2: 23 mmol/L (ref 20–29)
Chloride: 101 mmol/L (ref 96–106)
Creatinine, Ser: 0.79 mg/dL (ref 0.57–1.00)
GFR calc Af Amer: 98 mL/min/{1.73_m2} (ref >59)
GFR, EST NON AFRICAN AMERICAN: 85 mL/min/{1.73_m2} (ref >59)
GLOBULIN, TOTAL: 2.7 g/dL (ref 1.5–4.5)
Glucose: 94 mg/dL (ref 65–99)
POTASSIUM: 4.5 mmol/L (ref 3.5–5.2)
SODIUM: 141 mmol/L (ref 134–144)
Total Protein: 7.4 g/dL (ref 6.0–8.5)

## 2017-08-03 LAB — TSH: TSH: 0.081 u[IU]/mL — ABNORMAL LOW (ref 0.450–4.500)

## 2017-08-03 LAB — LIPID PANEL
CHOL/HDL RATIO: 3.8 ratio (ref 0.0–4.4)
Cholesterol, Total: 253 mg/dL — ABNORMAL HIGH (ref 100–199)
HDL: 66 mg/dL (ref >39)
LDL CALC: 125 mg/dL — AB (ref 0–99)
Triglycerides: 311 mg/dL — ABNORMAL HIGH (ref 0–149)
VLDL Cholesterol Cal: 62 mg/dL — ABNORMAL HIGH (ref 5–40)

## 2017-08-03 MED ORDER — ICOSAPENT ETHYL 1 G PO CAPS: 2.0000 g | ORAL_CAPSULE | Freq: Two times a day (BID) | ORAL | 11 refills | Status: DC

## 2017-08-03 NOTE — Progress Notes (Signed)
Added vascepa Caryl Pina, MD [facility] 08/03/2017, 1:53 PM

## 2017-08-13 ENCOUNTER — Ambulatory Visit: Payer: Medicare Other | Admitting: *Deleted

## 2017-08-14 ENCOUNTER — Encounter: Payer: Self-pay | Admitting: Family Medicine

## 2017-08-15 ENCOUNTER — Telehealth: Payer: Self-pay | Admitting: Family Medicine

## 2017-08-15 NOTE — Telephone Encounter (Signed)
Patient wanted to know the date and location of MRI that she had done last year. Gave patient information. Patient verbalized understanding

## 2017-08-31 ENCOUNTER — Telehealth: Payer: Self-pay | Admitting: Family Medicine

## 2017-08-31 NOTE — Telephone Encounter (Signed)
She has appt in [place] with Rndocrinology and the bad weather made them reschedule her. Her next Endocrinology appt is not until April - she is aware that we should not need to re- refer as the endo office already has the notes.  They can see from the referral that thyroid cancer and hypothyroid was documented as DX for the referral.  She is aware and will call them about the appt.

## 2017-10-23 DIAGNOSIS — Z85850 Personal history of malignant neoplasm of thyroid: Secondary | ICD-10-CM | POA: Diagnosis not present

## 2017-10-23 DIAGNOSIS — Z08 Encounter for follow-up examination after completed treatment for malignant neoplasm: Secondary | ICD-10-CM | POA: Diagnosis not present

## 2017-10-23 DIAGNOSIS — Z87891 Personal history of nicotine dependence: Secondary | ICD-10-CM | POA: Diagnosis not present

## 2017-10-23 DIAGNOSIS — E890 Postprocedural hypothyroidism: Secondary | ICD-10-CM | POA: Diagnosis not present

## 2017-10-23 DIAGNOSIS — M25512 Pain in left shoulder: Secondary | ICD-10-CM | POA: Diagnosis not present

## 2017-10-24 ENCOUNTER — Telehealth: Payer: Self-pay | Admitting: Family Medicine

## 2017-10-24 NOTE — Telephone Encounter (Signed)
Left message for patient to call with details to relay to her provider of any questions or concerns.

## 2017-11-06 ENCOUNTER — Telehealth: Payer: Self-pay | Admitting: Family Medicine

## 2017-11-06 NOTE — Telephone Encounter (Signed)
Patient is calling asking if Dr. Warrick Parisian has reviewed her labs and scans from [place]? Patient is going to cancel apt for 4/10 and re schedule after her previous labs have been reviewed.  Is concerned with her enlarged liver. Please advise. Patient would like to be called tomorrow after 1.

## 2017-11-07 ENCOUNTER — Ambulatory Visit: Payer: Medicare Other | Admitting: Family Medicine

## 2017-11-07 NOTE — Telephone Encounter (Signed)
I do not recall receiving anything from [place], [place] and I do not have anything on my desk or in her scanned chart

## 2017-11-07 NOTE — Telephone Encounter (Signed)
Patient reports that she had a scan several years ago that showed something abnormal in her liver, she is not sure what it is, where this was done or when it was done.  She reports she has been having some abdominal pain and wants someone to find out what is wrong.  She was offered an appointment for this afternoon, tonight, or tomorrow.  She said she cannot come in to be seen until 12/05/17 because she cannot get off work until then.  Advised patient that she should not wait that long, but she insists that she cannot come before then.  Appointment was made on 12/05/17 at 8:10 am.

## 2017-11-10 ENCOUNTER — Ambulatory Visit (INDEPENDENT_AMBULATORY_CARE_PROVIDER_SITE_OTHER): Payer: Medicare Other | Admitting: Family Medicine

## 2017-11-10 ENCOUNTER — Encounter: Payer: Self-pay | Admitting: Family Medicine

## 2017-11-10 VITALS — BP 132/71 | HR 88 | Temp 97.7°F | Ht 70.0 in | Wt 185.0 lb

## 2017-11-10 DIAGNOSIS — B029 Zoster without complications: Secondary | ICD-10-CM

## 2017-11-10 DIAGNOSIS — R160 Hepatomegaly, not elsewhere classified: Secondary | ICD-10-CM | POA: Diagnosis not present

## 2017-11-10 DIAGNOSIS — R300 Dysuria: Secondary | ICD-10-CM | POA: Diagnosis not present

## 2017-11-10 MED ORDER — ICOSAPENT ETHYL 1 G PO CAPS: 2.0000 g | ORAL_CAPSULE | Freq: Two times a day (BID) | ORAL | 11 refills | Status: AC

## 2017-11-10 MED ORDER — VALACYCLOVIR HCL 1 G PO TABS: 1000.0000 mg | ORAL_TABLET | Freq: Two times a day (BID) | ORAL | 0 refills | Status: DC

## 2017-11-10 NOTE — Progress Notes (Signed)
BP 132/71 (BP Location: Right Arm, Patient Position: Sitting, Cuff Size: Normal)   Pulse 88   Temp 97.7 F (36.5 C) (Oral)   Ht 5\' 10"  (1.778 m)   Wt 185 lb (83.9 kg)   BMI 26.54 kg/m    Subjective:    Patient ID: Tina Gardner, female    DOB: 1961/07/02, 57 y.o.   MRN: [id number]  HPI: Tina Gardner is a 57 y.o. female presenting on 11/10/2017 for Flank Pain (left flank pain for a few days.) and Rash (a few blisters in the center of her back)   HPI Left flank pain and rash on back Patient has been having left flank pain and a rash.  The rash started near her spine on the left side of her lower back and then she has pain shooting around into her abdomen on that left side.  She denies any nausea or vomiting.  She thought it may be a urinary problems that she was leaving a urine for [place] but has not had any dysuria or frequency.  She denies any fevers or chills.  She has never had this before and the pain she describes as quite severe and sharp.  Patient has a history of enlarged liver and says she never got a follow-up scan for this and would like a follow-up ultrasound for this.  Relevant past medical, surgical, family and social history reviewed and updated as indicated. Interim medical history since our last visit reviewed. Allergies and medications reviewed and updated.  Review of Systems  Constitutional: Negative for chills and fever.  Eyes: Negative for visual disturbance.  Respiratory: Negative for chest tightness and shortness of breath.   Cardiovascular: Negative for chest pain and leg swelling.  Gastrointestinal: Positive for abdominal pain.  Genitourinary: Positive for flank pain. Negative for difficulty urinating, dysuria, frequency and urgency.  Musculoskeletal: Negative for back pain and gait problem.  Skin: Positive for rash.  Neurological: Negative for light-headedness and headaches.  Psychiatric/Behavioral: Negative for agitation and behavioral problems.  All other  systems reviewed and are negative.   Per HPI unless specifically indicated above   Allergies as of 11/10/2017      Reactions   Anesthesia S-[id number] [propofol] Nausea And Vomiting      Medication List        Accurate as of 11/10/17 10:42 AM. Always use your most recent med list.          conjugated estrogens vaginal cream Commonly known as:  PREMARIN Place 1 Applicatorful vaginally daily.   Icosapent Ethyl 1 g Caps Commonly known as:  VASCEPA Take 2 capsules (2 g total) by mouth 2 (two) times daily.   levothyroxine 125 MCG tablet Commonly known as:  SYNTHROID, LEVOTHROID TAKE 1 TABLET (125 MCG TOTAL) BY MOUTH DAILY.   ranitidine 75 MG tablet Commonly known as:  ZANTAC Take 75 mg by mouth 2 (two) times daily.   valACYclovir 1000 MG tablet Commonly known as:  VALTREX Take 1 tablet (1,000 mg total) by mouth 2 (two) times daily.          Objective:    BP 132/71 (BP Location: Right Arm, Patient Position: Sitting, Cuff Size: Normal)   Pulse 88   Temp 97.7 F (36.5 C) (Oral)   Ht 5\' 10"  (1.778 m)   Wt 185 lb (83.9 kg)   BMI 26.54 kg/m   Wt Readings from Last 3 Encounters:  11/10/17 185 lb (83.9 kg)  08/02/17 180 lb (81.6 kg)  04/11/17 169 lb  9.6 oz (76.9 kg)    Physical Exam  Constitutional: She is oriented to person, place, and time. She appears well-developed and well-nourished. No distress.  Eyes: Pupils are equal, round, and reactive to light. Conjunctivae and EOM are normal.  Abdominal: There is tenderness in the left upper quadrant. There is no rigidity, no guarding and no CVA tenderness.  Neurological: She is alert and oriented to person, place, and time. Coordination normal.  Skin: Skin is warm and dry. Rash noted. She is not diaphoretic.     Psychiatric: She has a normal mood and affect. Her behavior is normal.  Nursing note and vitals reviewed.   Urine dip: Trace blood but otherwise normal    Assessment & Plan:   Problem List Items Addressed This  Visit    None    Visit Diagnoses    Dysuria    -  Primary   Urinalysis shows trace blood but otherwise normal   Relevant Orders   Urinalysis   Urine Culture   Herpes zoster without complication       Shingles on left lumbar region with pain coming around to her abdomen   Relevant Medications   valACYclovir (VALTREX) 1000 MG tablet   Hepatomegaly       Relevant Orders   US ABDOMEN LIMITED RUQ       Follow up plan: Return if symptoms worsen or fail to improve.  Counseling provided for all of the vaccine components Orders Placed This Encounter  Procedures  . Urine Culture  . US ABDOMEN LIMITED RUQ  . Urinalysis    Caryl Pina, MD [facility] 11/10/2017, 10:42 AM

## 2017-11-12 LAB — URINALYSIS
Bilirubin, UA: NEGATIVE
Glucose, UA: NEGATIVE
Ketones, UA: NEGATIVE
LEUKOCYTES UA: NEGATIVE
NITRITE UA: NEGATIVE
PH UA: 7 (ref 5.0–7.5)
Protein, UA: NEGATIVE
SPEC GRAV UA: 1.015 (ref 1.005–1.030)
Urobilinogen, Ur: 0.2 mg/dL (ref 0.2–1.0)

## 2017-11-14 LAB — URINE CULTURE

## 2017-11-16 ENCOUNTER — Ambulatory Visit (HOSPITAL_COMMUNITY)
Admission: RE | Admit: 2017-11-16 | Discharge: 2017-11-16 | Disposition: A | Discharge status: Home / Self Care | Payer: Medicare Other | Source: Ambulatory Visit | Attending: Family Medicine | Admitting: Family Medicine

## 2017-11-16 DIAGNOSIS — R160 Hepatomegaly, not elsewhere classified: Secondary | ICD-10-CM

## 2017-11-19 ENCOUNTER — Telehealth: Payer: Self-pay | Admitting: Family Medicine

## 2017-11-19 NOTE — Telephone Encounter (Signed)
Needs to be signed off on

## 2017-11-19 NOTE — Telephone Encounter (Signed)
-----   Message from Worthy Rancher, MD sent at 11/19/2017  1:10 PM EDT ----- Patient has a slightly enlarged liver but the consistency of the liver looks normal, no nodules or masses or other abnormalities besides being slightly more prominent.  This could just be her normal.

## 2017-11-19 NOTE — Telephone Encounter (Signed)
Pt notified of results Verbalizes understanding Pt concerned about edema Pt recently changed dosage of Levothyroxine Pt informed that due to dose change symptoms could be due to this Pt will continue to monitor and will schedule follow up appt if sxs persist or worsen

## 2017-11-19 NOTE — Telephone Encounter (Signed)
I put the ultrasound results in the pool

## 2017-11-22 DIAGNOSIS — E890 Postprocedural hypothyroidism: Secondary | ICD-10-CM | POA: Diagnosis not present

## 2017-11-23 ENCOUNTER — Telehealth: Payer: Self-pay

## 2017-11-23 DIAGNOSIS — Z08 Encounter for follow-up examination after completed treatment for malignant neoplasm: Secondary | ICD-10-CM | POA: Diagnosis not present

## 2017-11-23 DIAGNOSIS — E890 Postprocedural hypothyroidism: Secondary | ICD-10-CM | POA: Diagnosis not present

## 2017-11-23 NOTE — Telephone Encounter (Signed)
Patient wants a referral for GI

## 2017-11-26 ENCOUNTER — Ambulatory Visit (INDEPENDENT_AMBULATORY_CARE_PROVIDER_SITE_OTHER): Payer: Medicare Other | Admitting: Family Medicine

## 2017-11-26 ENCOUNTER — Ambulatory Visit (INDEPENDENT_AMBULATORY_CARE_PROVIDER_SITE_OTHER): Payer: Medicare Other

## 2017-11-26 ENCOUNTER — Encounter: Payer: Self-pay | Admitting: Family Medicine

## 2017-11-26 ENCOUNTER — Other Ambulatory Visit: Payer: Self-pay | Admitting: Family Medicine

## 2017-11-26 VITALS — BP 115/69 | HR 73 | Temp 97.7°F | Ht 70.0 in | Wt 183.8 lb

## 2017-11-26 DIAGNOSIS — M5412 Radiculopathy, cervical region: Secondary | ICD-10-CM

## 2017-11-26 DIAGNOSIS — M542 Cervicalgia: Secondary | ICD-10-CM | POA: Diagnosis not present

## 2017-11-26 MED ORDER — TRIAMCINOLONE ACETONIDE 40 MG/ML IJ SUSP
80.0000 mg | Freq: Once | INTRAMUSCULAR | Status: AC
Start: 2017-11-26 — End: 2017-11-26
  Administered 2017-11-26: 80 mg via INTRAMUSCULAR

## 2017-11-26 NOTE — Progress Notes (Signed)
   HPI  Patient presents today neck pain.  Patient reports 3 to 4 weeks of left-sided neck pain.  She states that it starts at the left cervical area and radiates down her left shoulder her left chest and left occipital area.  She denies any injury recently.  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  Her endocrinologist does not believe this is the source of the pain. She states that she does have a history of radiculopathy treated with epidural injections years ago.  She has multiple other complaints and concerns, she will follow-up with her PCP.  PMH: Smoking status noted ROS: Per HPI  Objective: BP 115/69   Pulse 73   Temp 97.7 F (36.5 C) (Oral)   Ht 5\' 10"  (1.778 m)   Wt 183 lb 12.8 oz (83.4 kg)   BMI 26.37 kg/m  Gen: NAD, alert, cooperative with exam HEENT: NCAT CV: RRR, good S1/S2, no murmur Resp: CTABL, no wheezes, non-labored Abd: SNTND, BS present, no guarding or organomegaly Ext: No edema, warm Neuro: Alert and oriented, No gross deficits MSK:  Midline tenderness to palpation of the cervical area, no tenderness of paraspinal muscles in the cervical spine, no lymphadenopathy  Skin:  5 erythematous papules that appear to be resolving shingles on the left low back starting at midline and extending laterally about 10 cm  Assessment and plan:  #Cervical radiculopathy Patient symptoms are consistent with degenerative disc disease with possible nerve involvement Plain film 80 mg of IM Kenalog given, she states that she cannot tolerate NSAIDs by mouth very well Orthopedic referral  Patient has close follow-up with her PCP in 9 days      Orders Placed This Encounter  Procedures  . Ambulatory referral to Orthopedic Surgery    Referral Priority:   Routine    Referral Type:   Surgical    Referral Reason:   Specialty Services Required    Requested Specialty:   Orthopedic  Surgery    Number of Visits Requested:   1    Meds ordered this encounter  Medications  . triamcinolone acetonide (KENALOG-40) injection 80 mg    Laroy Apple, MD [facility] 11/26/2017, 3:57 PM

## 2017-11-27 ENCOUNTER — Telehealth: Payer: Self-pay | Admitting: Family Medicine

## 2017-11-27 NOTE — Telephone Encounter (Signed)
Pt aware.

## 2017-11-27 NOTE — Telephone Encounter (Signed)
Please have her discuss this with me when she comes for her visit

## 2017-11-27 NOTE — Telephone Encounter (Signed)
Pt notified to discuss at appt

## 2017-11-28 ENCOUNTER — Encounter: Payer: Self-pay | Admitting: Family Medicine

## 2017-11-28 ENCOUNTER — Ambulatory Visit (INDEPENDENT_AMBULATORY_CARE_PROVIDER_SITE_OTHER): Payer: Medicare Other | Admitting: Family Medicine

## 2017-11-28 VITALS — BP 124/68 | HR 76 | Temp 98.8°F | Resp 20 | Ht 70.0 in | Wt 180.0 lb

## 2017-11-28 DIAGNOSIS — J189 Pneumonia, unspecified organism: Secondary | ICD-10-CM | POA: Diagnosis not present

## 2017-11-28 MED ORDER — AMOXICILLIN-POT CLAVULANATE 875-125 MG PO TABS
1.0000 | ORAL_TABLET | Freq: Two times a day (BID) | ORAL | 0 refills | Status: DC
Start: 2017-11-28 — End: 2017-11-28

## 2017-11-28 MED ORDER — AMOXICILLIN-POT CLAVULANATE 875-125 MG PO TABS: 1.0000 | ORAL_TABLET | Freq: Two times a day (BID) | ORAL | 0 refills | Status: AC

## 2017-11-28 MED ORDER — ALBUTEROL SULFATE HFA 108 (90 BASE) MCG/ACT IN AERS
2.0000 | INHALATION_SPRAY | Freq: Four times a day (QID) | RESPIRATORY_TRACT | 0 refills | Status: DC | PRN
Start: 2017-11-28 — End: 2017-11-28

## 2017-11-28 MED ORDER — ALBUTEROL SULFATE HFA 108 (90 BASE) MCG/ACT IN AERS: 2.0000 | INHALATION_SPRAY | Freq: Four times a day (QID) | RESPIRATORY_TRACT | 0 refills | Status: AC | PRN

## 2017-11-28 NOTE — Progress Notes (Signed)
BP 124/68   Pulse 76   Temp 98.8 F (37.1 C) (Oral)   Resp 20   Ht 5\' 10"  (1.778 m)   Wt 180 lb (81.6 kg)   SpO2 99%   BMI 25.83 kg/m    Subjective:    Patient ID: Tina Gardner, female    DOB: [birth year], 57 y.o.   MRN: [id number]  HPI: Tina Gardner is a 57 y.o. female presenting on 11/28/2017 for Fatigue; Chills; Shortness of Breath; and Headache   HPI Chills and shortness of breath and headache Patient comes in with complaints of chills and shortness of breath and headache and fatigue that has been going on for the past 2 days.  She says she has been having a cough that is mostly dry and nonproductive but she feels like it is getting down into her chest and she just cannot clear it out.  She has had low-grade temperatures in the 99 range and she is starting to feel short of breath and like she is getting tightness down in her chest.  She has not used anything over-the-counter for it to this point except Tylenol and ibuprofen which have been helping some with the achiness but not helping with anything else.  She just feels like she is getting worse over the past day and that is what brought her in today.  Relevant past medical, surgical, family and social history reviewed and updated as indicated. Interim medical history since our last visit reviewed. Allergies and medications reviewed and updated.  Review of Systems  Constitutional: Negative for chills and fever.  HENT: Positive for congestion, postnasal drip, rhinorrhea, sinus pressure, sneezing and sore throat. Negative for ear discharge and ear pain.   Eyes: Negative for pain, redness and visual disturbance.  Respiratory: Positive for cough, chest tightness and shortness of breath. Negative for wheezing.   Cardiovascular: Negative for chest pain and leg swelling.  Genitourinary: Negative for difficulty urinating and dysuria.  Musculoskeletal: Negative for back pain and gait problem.  Skin: Negative for rash.  Neurological:  Negative for light-headedness and headaches.  Psychiatric/Behavioral: Negative for agitation and behavioral problems.  All other systems reviewed and are negative.   Per HPI unless specifically indicated above   Allergies as of 11/28/2017      Reactions   Anesthesia S-[id number] [propofol] Nausea And Vomiting      Medication List        Accurate as of 11/28/17  6:42 PM. Always use your most recent med list.          albuterol 108 (90 Base) MCG/ACT inhaler Commonly known as:  PROVENTIL HFA;VENTOLIN HFA Inhale 2 puffs into the lungs every 6 (six) hours as needed for wheezing or shortness of breath.   amoxicillin-clavulanate 875-125 MG tablet Commonly known as:  AUGMENTIN Take 1 tablet by mouth 2 (two) times daily.   Icosapent Ethyl 1 g Caps Commonly known as:  VASCEPA Take 2 capsules (2 g total) by mouth 2 (two) times daily.   levothyroxine 125 MCG tablet Commonly known as:  SYNTHROID, LEVOTHROID TAKE 1 TABLET (125 MCG TOTAL) BY MOUTH DAILY.   ranitidine 75 MG tablet Commonly known as:  ZANTAC Take 75 mg by mouth 2 (two) times daily.          Objective:    BP 124/68   Pulse 76   Temp 98.8 F (37.1 C) (Oral)   Resp 20   Ht 5\' 10"  (1.778 m)   Wt 180 lb (81.6 kg)  SpO2 99%   BMI 25.83 kg/m   Wt Readings from Last 3 Encounters:  11/28/17 180 lb (81.6 kg)  11/26/17 183 lb 12.8 oz (83.4 kg)  11/10/17 185 lb (83.9 kg)    Physical Exam  Constitutional: She is oriented to person, place, and time. She appears well-developed and well-nourished. No distress.  HENT:  Right Ear: Tympanic membrane, external ear and ear canal normal.  Left Ear: Tympanic membrane, external ear and ear canal normal.  Nose: Mucosal edema and rhinorrhea present. No epistaxis. Right sinus exhibits no maxillary sinus tenderness and no frontal sinus tenderness. Left sinus exhibits no maxillary sinus tenderness and no frontal sinus tenderness.  Mouth/Throat: Uvula is midline and mucous membranes are  normal. Posterior oropharyngeal edema and posterior oropharyngeal erythema present. No oropharyngeal exudate or tonsillar abscesses.  Eyes: Conjunctivae and EOM are normal.  Cardiovascular: Normal rate, regular rhythm, normal heart sounds and intact distal pulses.  No murmur heard. Pulmonary/Chest: Effort normal. No respiratory distress. She has no wheezes. She has rhonchi. She has no rales.  Musculoskeletal: Normal range of motion. She exhibits no edema or tenderness.  Neurological: She is alert and oriented to person, place, and time. Coordination normal.  Skin: Skin is warm and dry. No rash noted. She is not diaphoretic.  Psychiatric: She has a normal mood and affect. Her behavior is normal.  Vitals reviewed.       Assessment & Plan:   Problem List Items Addressed This Visit    None    Visit Diagnoses    Atypical pneumonia    -  Primary   Relevant Medications   albuterol (PROVENTIL HFA;VENTOLIN HFA) 108 (90 Base) MCG/ACT inhaler   amoxicillin-clavulanate (AUGMENTIN) 875-125 MG tablet       Follow up plan: Return if symptoms worsen or fail to improve.  Counseling provided for all of the vaccine components No orders of the defined types were placed in this encounter.   Caryl Pina, MD [facility] 11/28/2017, 6:42 PM

## 2017-12-05 ENCOUNTER — Ambulatory Visit: Payer: Medicare Other | Admitting: Family Medicine

## 2017-12-05 ENCOUNTER — Telehealth: Payer: Self-pay | Admitting: *Deleted

## 2017-12-05 DIAGNOSIS — R1011 Right upper quadrant pain: Secondary | ICD-10-CM

## 2017-12-05 NOTE — Telephone Encounter (Signed)
Pt requesting referral to GI for abdominal pain Per Dr Dettinger referral okay

## 2017-12-07 ENCOUNTER — Encounter: Payer: Self-pay | Admitting: Family Medicine

## 2017-12-07 ENCOUNTER — Ambulatory Visit: Payer: Medicare Other | Admitting: Family Medicine

## 2017-12-07 DIAGNOSIS — K219 Gastro-esophageal reflux disease without esophagitis: Secondary | ICD-10-CM | POA: Diagnosis not present

## 2017-12-07 DIAGNOSIS — R1011 Right upper quadrant pain: Secondary | ICD-10-CM | POA: Diagnosis not present

## 2017-12-07 DIAGNOSIS — R112 Nausea with vomiting, unspecified: Secondary | ICD-10-CM | POA: Diagnosis not present

## 2017-12-18 DIAGNOSIS — G8929 Other chronic pain: Secondary | ICD-10-CM | POA: Diagnosis not present

## 2017-12-18 DIAGNOSIS — M25512 Pain in left shoulder: Secondary | ICD-10-CM | POA: Diagnosis not present

## 2017-12-18 DIAGNOSIS — M5412 Radiculopathy, cervical region: Secondary | ICD-10-CM | POA: Diagnosis not present

## 2017-12-25 ENCOUNTER — Other Ambulatory Visit: Payer: Self-pay | Admitting: Family Medicine

## 2017-12-25 DIAGNOSIS — J189 Pneumonia, unspecified organism: Secondary | ICD-10-CM

## 2018-03-11 ENCOUNTER — Telehealth: Payer: Self-pay | Admitting: Family Medicine

## 2018-08-11 IMAGING — CT CT RENAL STONE PROTOCOL
2 of 4 series · 16 of 46 positions shown, 18 images · non-contrast
Comparison: None.

CLINICAL DATA: Right flank pain. Urinary retention and burning with
urination. Symptoms for 2 days.

EXAM:
CT ABDOMEN AND PELVIS WITHOUT CONTRAST
TECHNIQUE: Multidetector CT imaging of the abdomen and pelvis was performed
following the standard protocol without IV contrast.

[Series 2: axial st · axial · 0.74mm/px · z∈[-475,-70]mm · 13 of 89 slices shown, 15 images]
[im 4/89  soft-tissue]
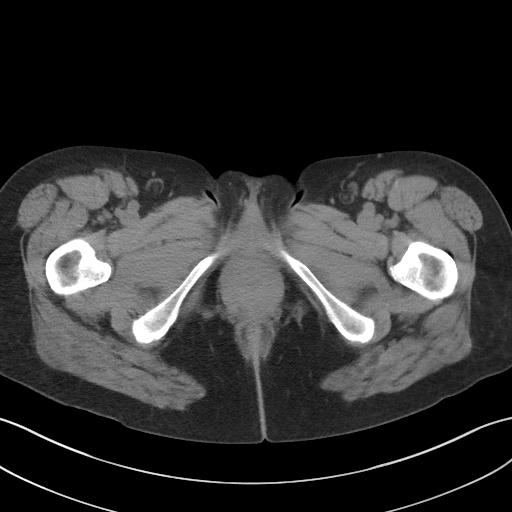
[im 4/89  bone]
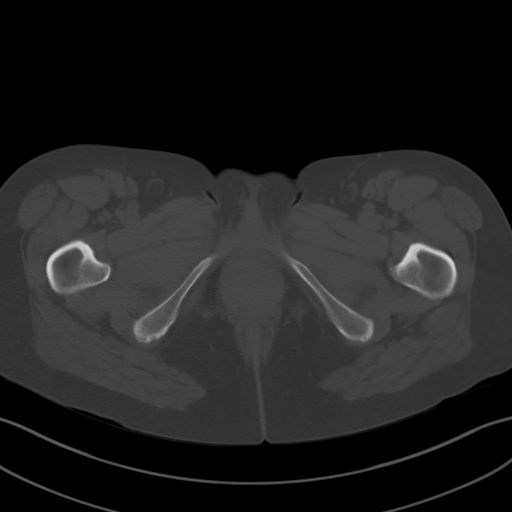
[im 11/89  soft-tissue]
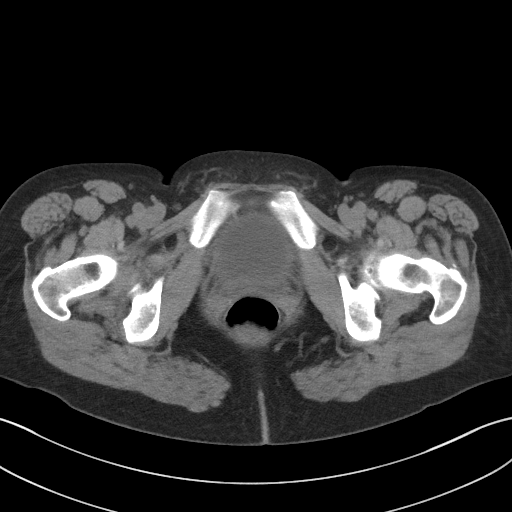
[im 18/89  soft-tissue]
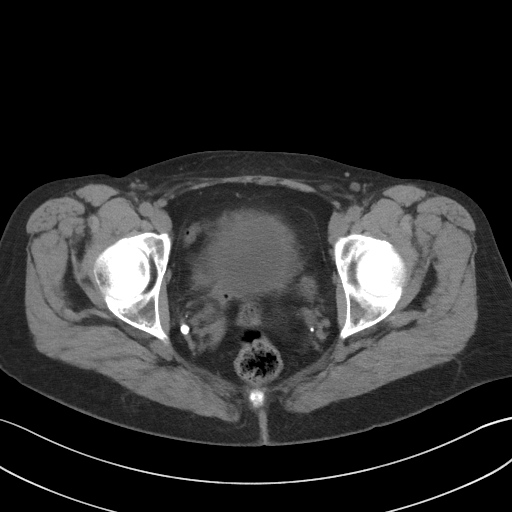
[im 25/89  soft-tissue]
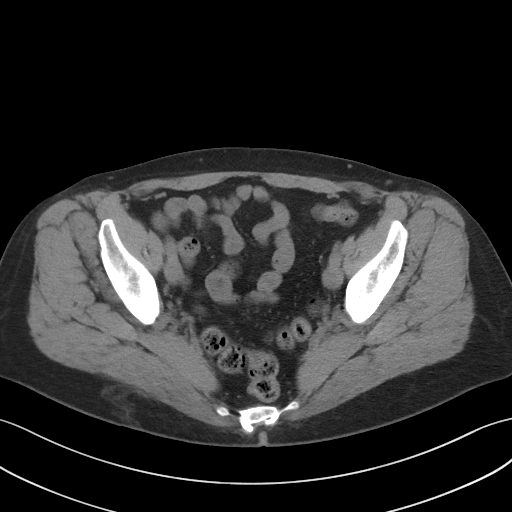
[im 32/89  soft-tissue]
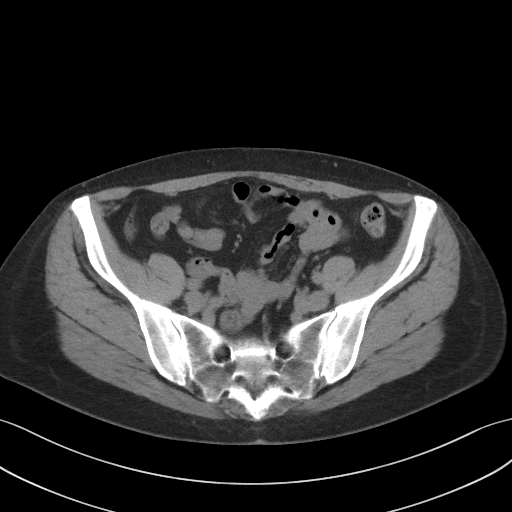
[im 39/89  soft-tissue]
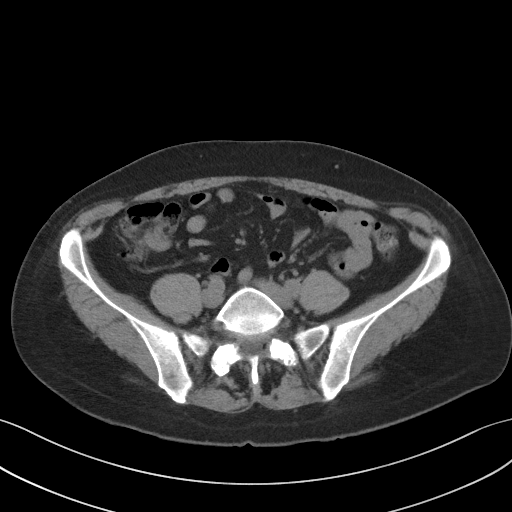
[im 46/89  soft-tissue]
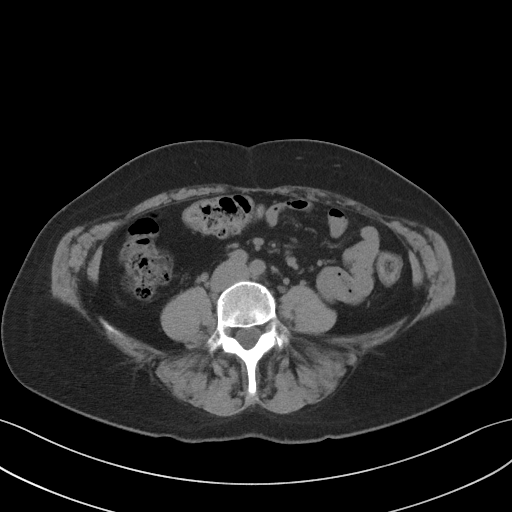
[im 50/89  soft-tissue]
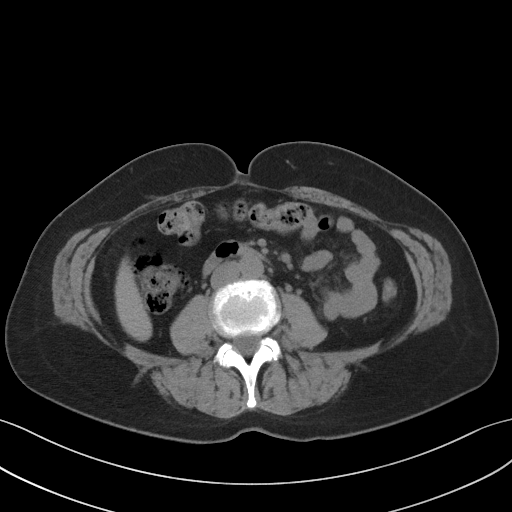
[im 57/89  soft-tissue]
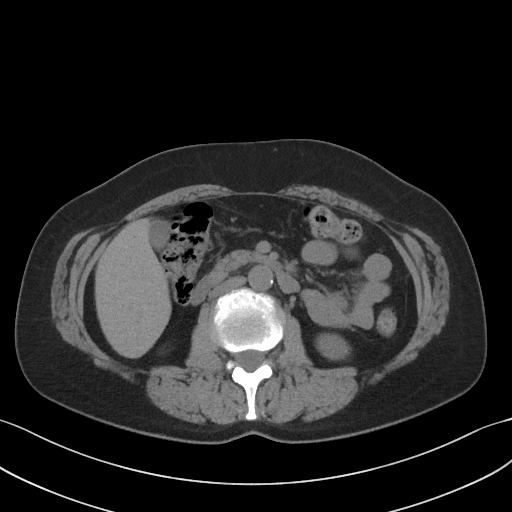
[im 57/89  bone]
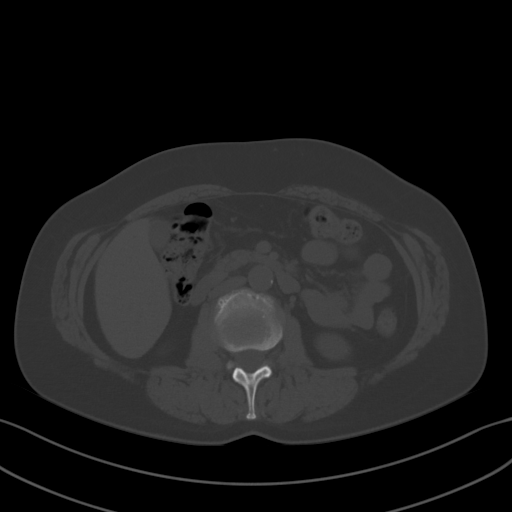
[im 64/89  soft-tissue]
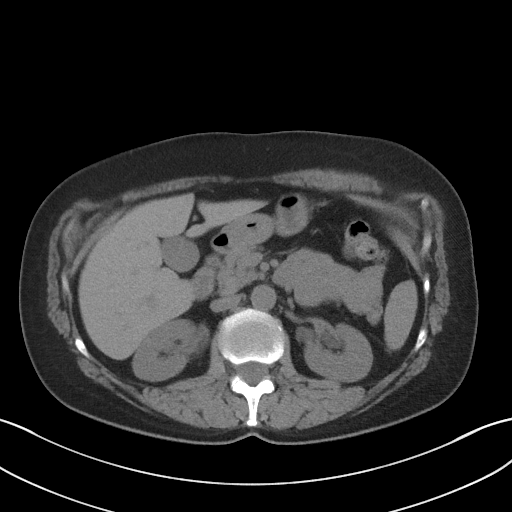
[im 71/89  soft-tissue]
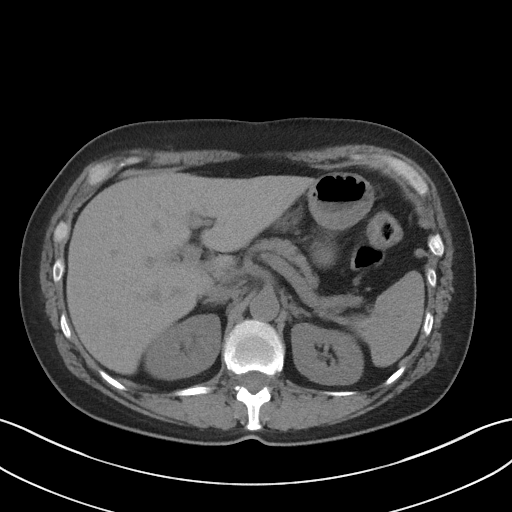
[im 78/89  soft-tissue]
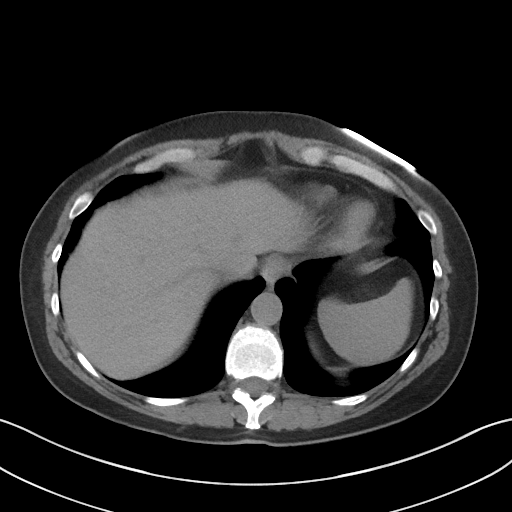
[im 85/89  soft-tissue]
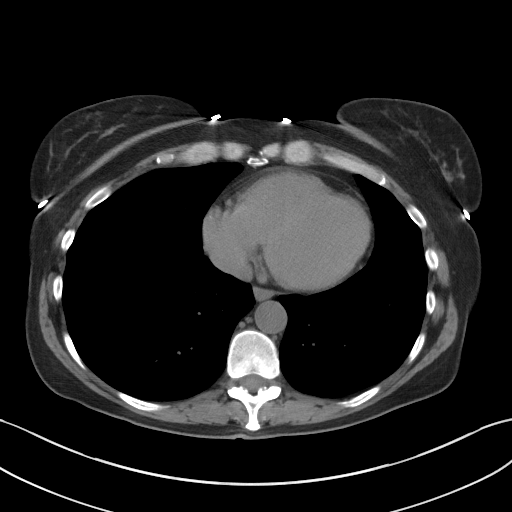

[Series 5: coronal st · coronal · 0.70mm/px · 3 of 82 slices shown]
[im 28/82  soft-tissue]
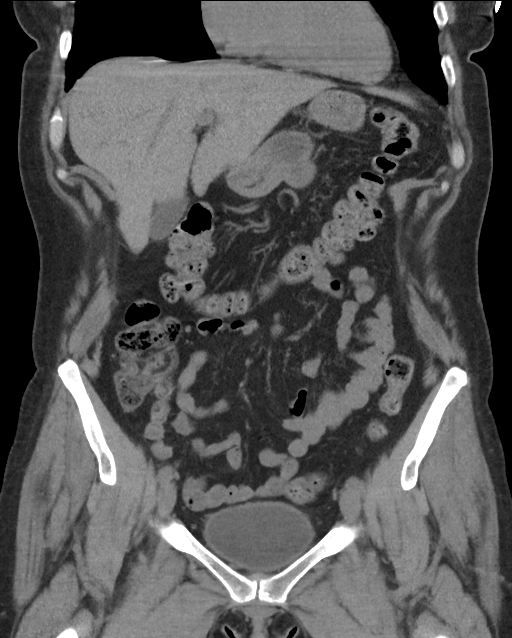
[im 37/82  soft-tissue]
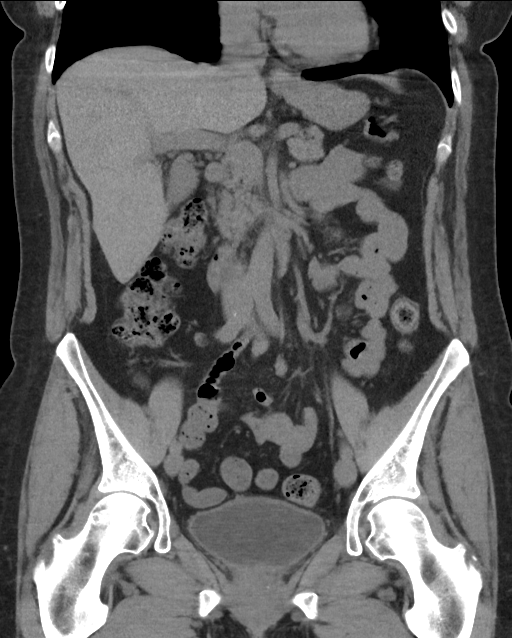
[im 46/82  soft-tissue]
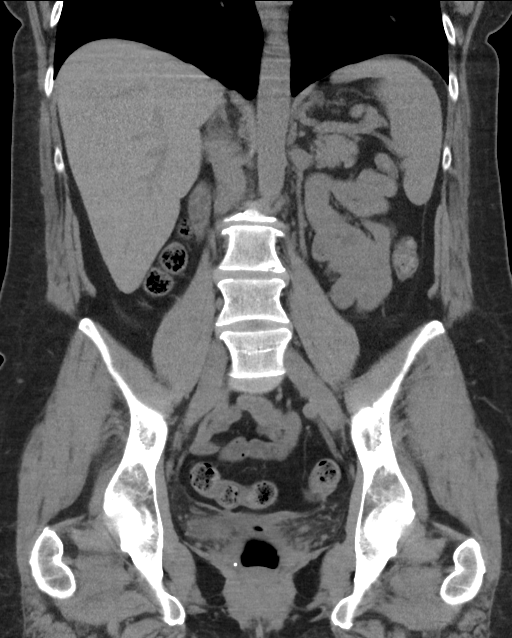

[16 of 46 positions shown; findings below may reference images not displayed]

FINDINGS: Lower chest: Lung bases are clear. No pleural or pericardial
effusion.

Hepatobiliary: No focal liver abnormality is seen. No gallstones,
gallbladder wall thickening, or biliary dilatation.

Pancreas: Unremarkable. No pancreatic ductal dilatation or
surrounding inflammatory changes.

Spleen: Normal in size without focal abnormality.

Adrenals/Urinary Tract: Adrenal glands are unremarkable. Kidneys are
normal, without renal calculi, focal lesion, or hydronephrosis.
Bladder is unremarkable.

Stomach/Bowel: Stomach is within normal limits. Appendix appears
normal. No evidence of bowel wall thickening, distention, or
inflammatory changes.

Vascular/Lymphatic: No significant vascular findings are present. No
enlarged abdominal or pelvic lymph nodes.

Reproductive: Status post hysterectomy. No adnexal masses.

Other: No ascites. Infiltration of subcutaneous fat over the right
buttock may be due to injection.

Musculoskeletal: There is mild convex right scoliosis. Facet
degenerative change L5-S1 noted.
IMPRESSION: 1. No acute abnormality.  Negative for urinary tract stone.
2. Infiltration of subcutaneous fat over the right buttock may be
related to injection of medicine.
3. Status post hysterectomy.

## 2018-11-09 IMAGING — US US ABDOMEN LIMITED
1 series · 14 of 25 positions shown · non-contrast
Comparison: CT 02/13/2017.

CLINICAL DATA: Hepatomegaly.  History of thyroid cancer.

EXAM:
ULTRASOUND ABDOMEN LIMITED RIGHT UPPER QUADRANT

[Series 1: us abdomen limited · 0.21mm/px · 14 of 71 slices shown]
[im 1/71]
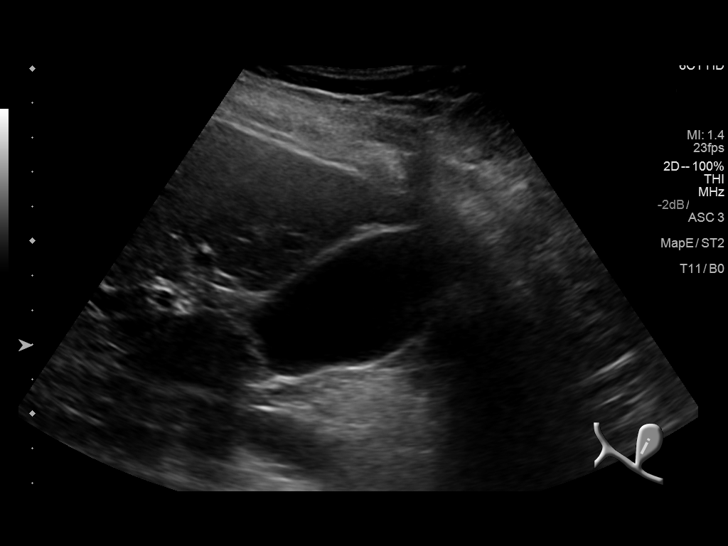
[im 6/71]
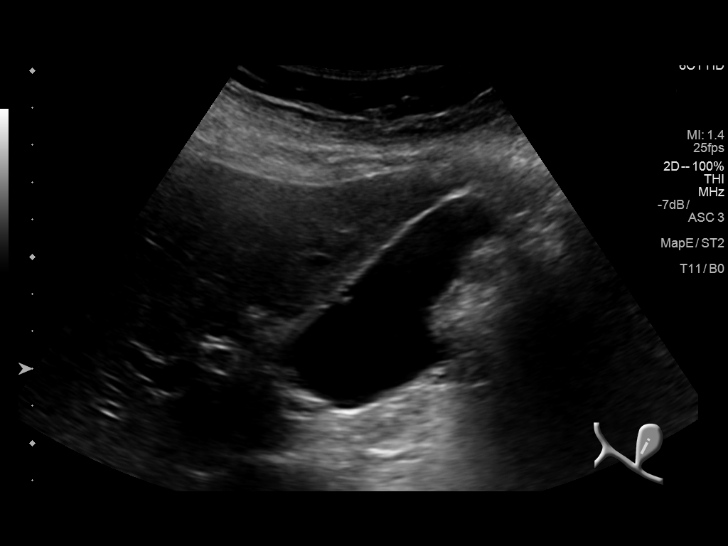
[im 12/71]
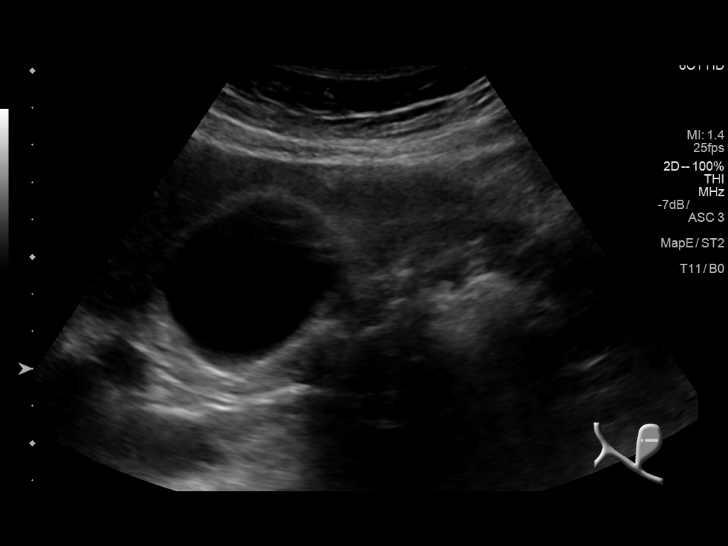
[im 18/71]
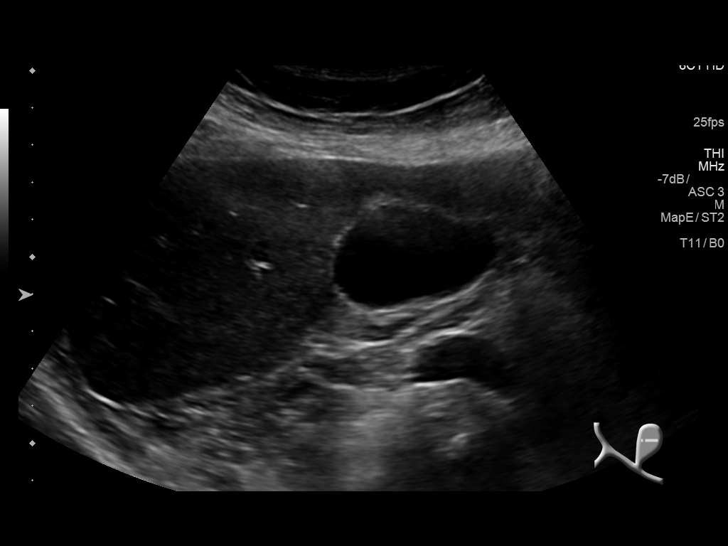
[im 24/71]
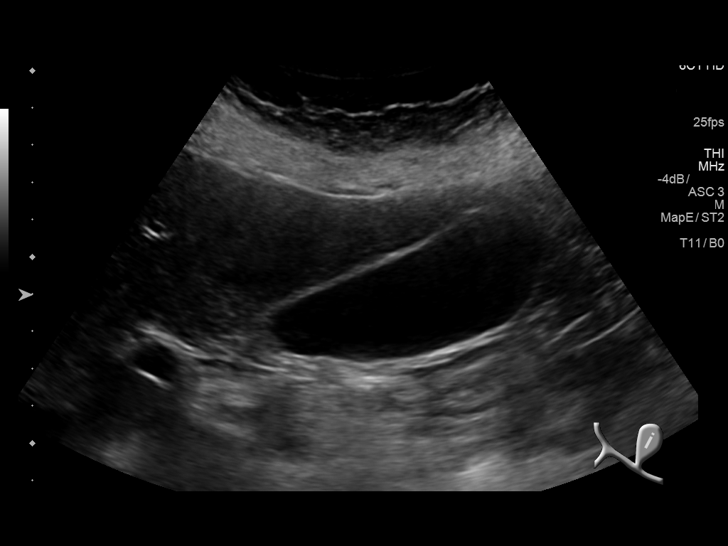
[im 27/71]
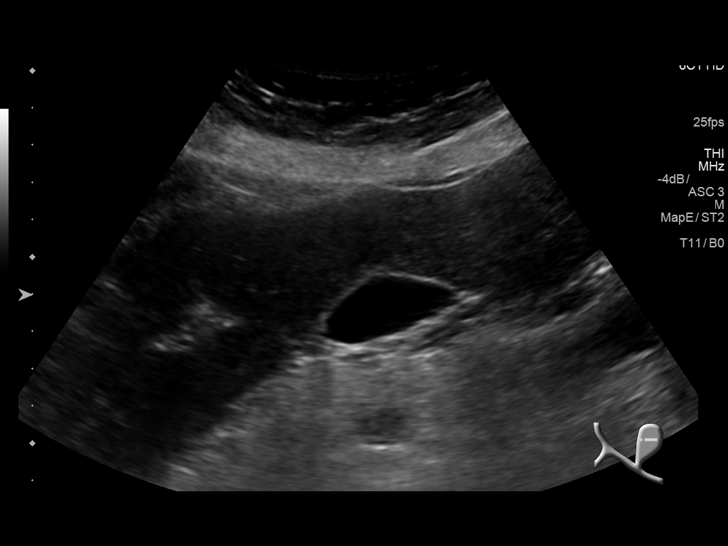
[im 33/71]
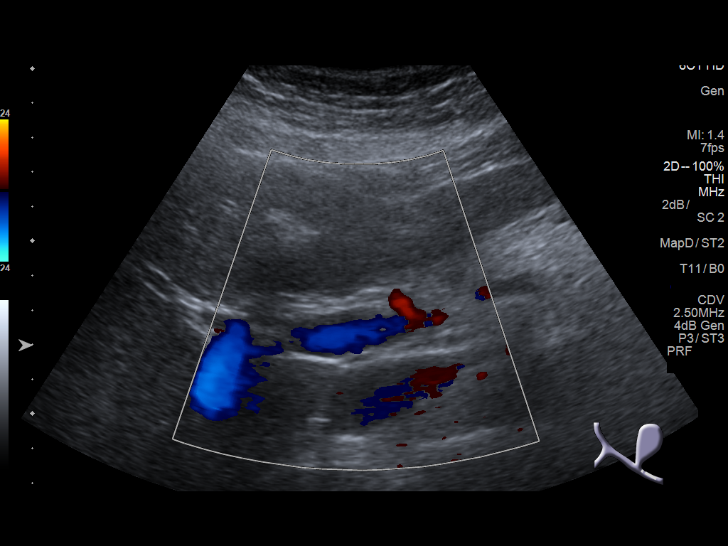
[im 38/71]
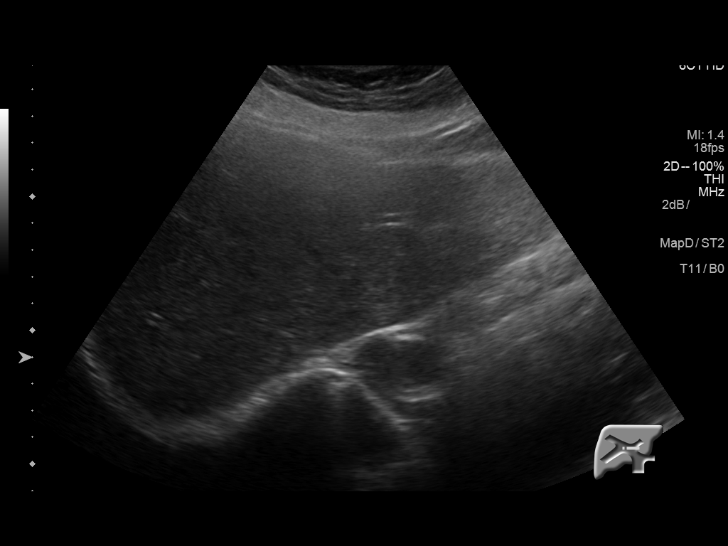
[im 44/71]
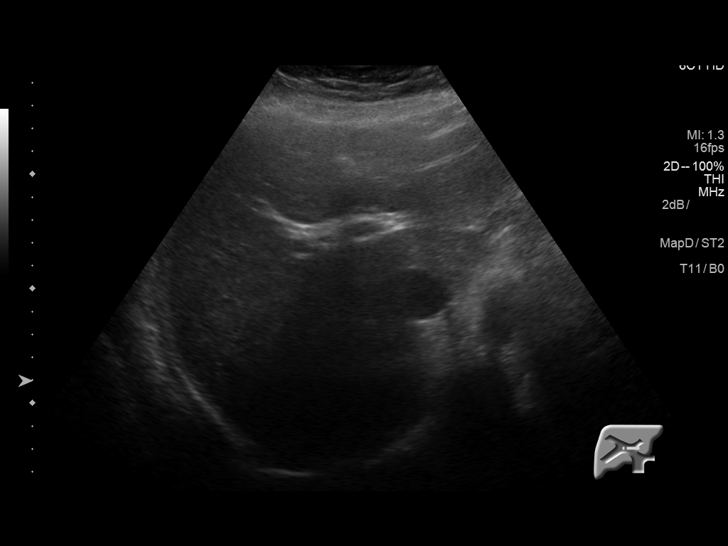
[im 47/71]
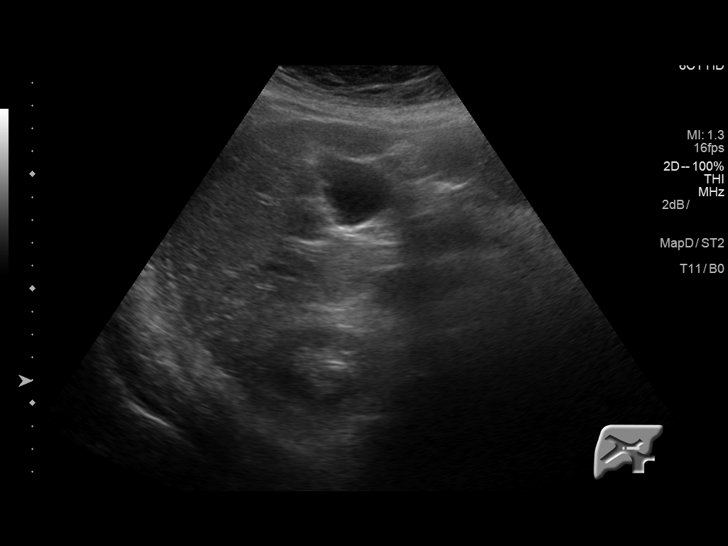
[im 53/71]
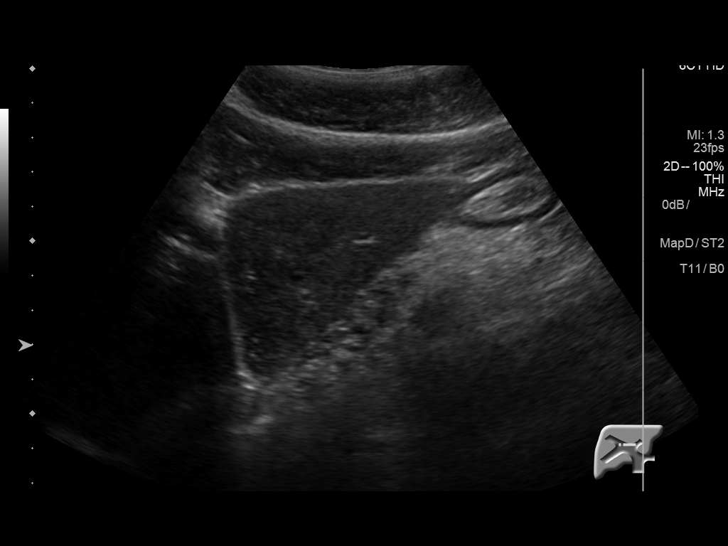
[im 59/71]
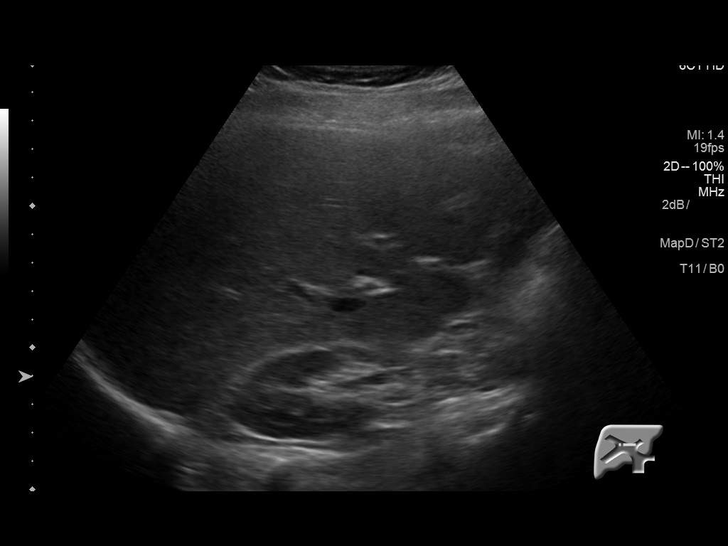
[im 65/71]
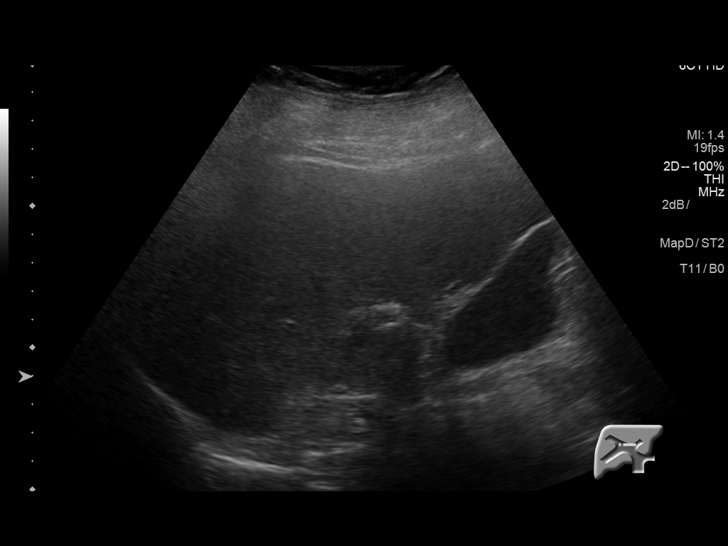
[im 71/71]
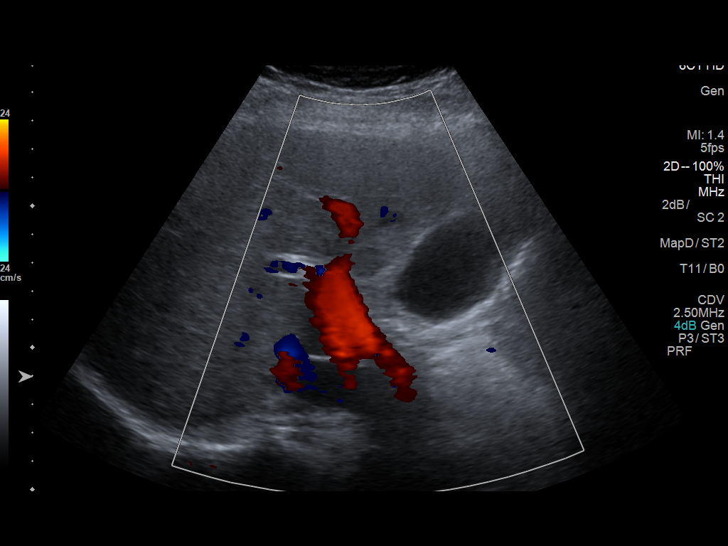

[14 of 25 positions shown; findings below may reference images not displayed]

FINDINGS: Gallbladder:

No gallstones or wall thickening visualized. No sonographic Murphy
sign noted by sonographer.

Common bile duct:

Diameter: 1.8 mm

Liver:

Liver appears prominent. No focal hepatic abnormality identified. No
mass lesion. Normal echotexture. Portal vein is patent on color
Doppler imaging with normal direction of blood flow towards the
liver.
IMPRESSION: Liver appears prominent. Exam is otherwise unremarkable. Liver
echotexture appears normal. No hepatic mass lesion noted. No
gallstones or biliary distention.
